# Patient Record
Sex: Male | Born: 1945 | Race: White | Hispanic: No | Smoking: Never smoker
Health system: Southern US, Community
[De-identification: ages and names within clinical notes are randomized; demographics above are authoritative.]

## PROBLEM LIST (undated history)

## (undated) DIAGNOSIS — G459 Transient cerebral ischemic attack, unspecified: Secondary | ICD-10-CM

## (undated) DIAGNOSIS — G4730 Sleep apnea, unspecified: Secondary | ICD-10-CM

## (undated) DIAGNOSIS — I10 Essential (primary) hypertension: Secondary | ICD-10-CM

## (undated) DIAGNOSIS — I255 Ischemic cardiomyopathy: Secondary | ICD-10-CM

## (undated) DIAGNOSIS — N289 Disorder of kidney and ureter, unspecified: Secondary | ICD-10-CM

## (undated) DIAGNOSIS — E785 Hyperlipidemia, unspecified: Secondary | ICD-10-CM

## (undated) DIAGNOSIS — Z87442 Personal history of urinary calculi: Secondary | ICD-10-CM

## (undated) DIAGNOSIS — E119 Type 2 diabetes mellitus without complications: Secondary | ICD-10-CM

## (undated) DIAGNOSIS — F329 Major depressive disorder, single episode, unspecified: Secondary | ICD-10-CM

## (undated) DIAGNOSIS — I214 Non-ST elevation (NSTEMI) myocardial infarction: Secondary | ICD-10-CM

## (undated) DIAGNOSIS — T603X1A Toxic effect of herbicides and fungicides, accidental (unintentional), initial encounter: Secondary | ICD-10-CM

## (undated) DIAGNOSIS — F32A Depression, unspecified: Secondary | ICD-10-CM

## (undated) DIAGNOSIS — I2510 Atherosclerotic heart disease of native coronary artery without angina pectoris: Secondary | ICD-10-CM

## (undated) DIAGNOSIS — G5621 Lesion of ulnar nerve, right upper limb: Secondary | ICD-10-CM

## (undated) HISTORY — PX: CORONARY ARTERY BYPASS GRAFT: SHX141

---
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
## 2015-02-20 ENCOUNTER — Encounter (HOSPITAL_COMMUNITY): Payer: Self-pay | Admitting: Physician Assistant

## 2015-02-20 ENCOUNTER — Encounter (HOSPITAL_BASED_OUTPATIENT_CLINIC_OR_DEPARTMENT_OTHER): Payer: Self-pay | Admitting: *Deleted

## 2015-02-20 ENCOUNTER — Encounter (HOSPITAL_BASED_OUTPATIENT_CLINIC_OR_DEPARTMENT_OTHER)
Admission: EM | Disposition: A | Discharge status: Other Acute Inpatient Hospital | Payer: Self-pay | Source: Home / Self Care | Attending: Emergency Medicine

## 2015-02-20 ENCOUNTER — Encounter (HOSPITAL_COMMUNITY)
Admission: AD | Disposition: A | Discharge status: Home / Self Care | Payer: Self-pay | Source: Ambulatory Visit | Attending: Cardiovascular Disease

## 2015-02-20 ENCOUNTER — Emergency Department (HOSPITAL_BASED_OUTPATIENT_CLINIC_OR_DEPARTMENT_OTHER)
Admission: EM | Admit: 2015-02-20 | Discharge: 2015-02-20 | Disposition: A | Discharge status: Other Acute Inpatient Hospital | Payer: Medicare Other | Source: Home / Self Care | Attending: Emergency Medicine | Admitting: Emergency Medicine

## 2015-02-20 ENCOUNTER — Inpatient Hospital Stay (HOSPITAL_COMMUNITY)
Admission: AD | Admit: 2015-02-20 | Discharge: 2015-02-22 | DRG: 247 | Disposition: A | Discharge status: Home / Self Care | Payer: Medicare Other | Source: Ambulatory Visit | Attending: Cardiovascular Disease | Admitting: Cardiovascular Disease

## 2015-02-20 DIAGNOSIS — E785 Hyperlipidemia, unspecified: Secondary | ICD-10-CM

## 2015-02-20 DIAGNOSIS — I25810 Atherosclerosis of coronary artery bypass graft(s) without angina pectoris: Secondary | ICD-10-CM | POA: Diagnosis present

## 2015-02-20 DIAGNOSIS — I25119 Atherosclerotic heart disease of native coronary artery with unspecified angina pectoris: Secondary | ICD-10-CM | POA: Insufficient documentation

## 2015-02-20 DIAGNOSIS — E119 Type 2 diabetes mellitus without complications: Secondary | ICD-10-CM

## 2015-02-20 DIAGNOSIS — Z951 Presence of aortocoronary bypass graft: Secondary | ICD-10-CM | POA: Insufficient documentation

## 2015-02-20 DIAGNOSIS — I213 ST elevation (STEMI) myocardial infarction of unspecified site: Secondary | ICD-10-CM

## 2015-02-20 DIAGNOSIS — Z7982 Long term (current) use of aspirin: Secondary | ICD-10-CM

## 2015-02-20 DIAGNOSIS — I2109 ST elevation (STEMI) myocardial infarction involving other coronary artery of anterior wall: Secondary | ICD-10-CM | POA: Diagnosis not present

## 2015-02-20 DIAGNOSIS — I10 Essential (primary) hypertension: Secondary | ICD-10-CM

## 2015-02-20 DIAGNOSIS — I2510 Atherosclerotic heart disease of native coronary artery without angina pectoris: Secondary | ICD-10-CM

## 2015-02-20 DIAGNOSIS — Z8673 Personal history of transient ischemic attack (TIA), and cerebral infarction without residual deficits: Secondary | ICD-10-CM

## 2015-02-20 DIAGNOSIS — I2121 ST elevation (STEMI) myocardial infarction involving left circumflex coronary artery: Secondary | ICD-10-CM | POA: Diagnosis not present

## 2015-02-20 DIAGNOSIS — I2102 ST elevation (STEMI) myocardial infarction involving left anterior descending coronary artery: Secondary | ICD-10-CM | POA: Diagnosis present

## 2015-02-20 DIAGNOSIS — Z9889 Other specified postprocedural states: Secondary | ICD-10-CM

## 2015-02-20 DIAGNOSIS — R55 Syncope and collapse: Secondary | ICD-10-CM | POA: Diagnosis present

## 2015-02-20 DIAGNOSIS — Z79899 Other long term (current) drug therapy: Secondary | ICD-10-CM

## 2015-02-20 DIAGNOSIS — I209 Angina pectoris, unspecified: Secondary | ICD-10-CM

## 2015-02-20 HISTORY — DX: Hyperlipidemia, unspecified: E78.5

## 2015-02-20 HISTORY — DX: Ischemic cardiomyopathy: I25.5

## 2015-02-20 HISTORY — DX: Disorder of kidney and ureter, unspecified: N28.9

## 2015-02-20 HISTORY — DX: Type 2 diabetes mellitus without complications: E11.9

## 2015-02-20 HISTORY — DX: Transient cerebral ischemic attack, unspecified: G45.9

## 2015-02-20 HISTORY — PX: CARDIAC CATHETERIZATION: SHX172

## 2015-02-20 HISTORY — DX: Essential (primary) hypertension: I10

## 2015-02-20 HISTORY — DX: Atherosclerotic heart disease of native coronary artery without angina pectoris: I25.10

## 2015-02-20 LAB — COMPREHENSIVE METABOLIC PANEL
ALT: 67 U/L — ABNORMAL HIGH (ref 17–63)
AST: 84 U/L — ABNORMAL HIGH (ref 15–41)
Albumin: 4.1 g/dL (ref 3.5–5.0)
Alkaline Phosphatase: 84 U/L (ref 38–126)
Anion gap: 9 (ref 5–15)
BUN: 16 mg/dL (ref 6–20)
CO2: 24 mmol/L (ref 22–32)
Calcium: 9.3 mg/dL (ref 8.9–10.3)
Chloride: 104 mmol/L (ref 101–111)
Creatinine, Ser: 1.29 mg/dL — ABNORMAL HIGH (ref 0.61–1.24)
GFR calc Af Amer: >60 mL/min (ref >60)
GFR calc non Af Amer: 55 mL/min — ABNORMAL LOW (ref >60)
Glucose, Bld: 352 mg/dL — ABNORMAL HIGH (ref 65–99)
POTASSIUM: 4.2 mmol/L (ref 3.5–5.1)
Sodium: 137 mmol/L (ref 135–145)
Total Bilirubin: 0.6 mg/dL (ref 0.3–1.2)
Total Protein: 7.3 g/dL (ref 6.5–8.1)

## 2015-02-20 LAB — POCT I-STAT, CHEM 8
BUN: 22 mg/dL — AB (ref 6–20)
CALCIUM ION: 1.26 mmol/L (ref 1.13–1.30)
CREATININE: 1.2 mg/dL (ref 0.61–1.24)
Chloride: 103 mmol/L (ref 101–111)
Glucose, Bld: 367 mg/dL — ABNORMAL HIGH (ref 65–99)
HEMATOCRIT: 44 % (ref 39.0–52.0)
HEMOGLOBIN: 15 g/dL (ref 13.0–17.0)
POTASSIUM: 4.3 mmol/L (ref 3.5–5.1)
SODIUM: 137 mmol/L (ref 135–145)
TCO2: 24 mmol/L (ref 0–100)

## 2015-02-20 LAB — MRSA PCR SCREENING: MRSA by PCR: NEGATIVE

## 2015-02-20 LAB — TROPONIN I
TROPONIN I: 0.11 ng/mL — AB (ref <0.031)
TROPONIN I: 22.04 ng/mL — AB (ref <0.031)

## 2015-02-20 LAB — APTT: APTT: 66 s — AB (ref 24–37)

## 2015-02-20 LAB — CBC
HEMATOCRIT: 41.5 % (ref 39.0–52.0)
HEMOGLOBIN: 14.3 g/dL (ref 13.0–17.0)
MCH: 32.1 pg (ref 26.0–34.0)
MCHC: 34.5 g/dL (ref 30.0–36.0)
MCV: 93 fL (ref 78.0–100.0)
Platelets: 244 10*3/uL (ref 150–400)
RBC: 4.46 MIL/uL (ref 4.22–5.81)
RDW: 13.7 % (ref 11.5–15.5)
WBC: 10.8 10*3/uL — AB (ref 4.0–10.5)

## 2015-02-20 LAB — GLUCOSE, CAPILLARY
GLUCOSE-CAPILLARY: 219 mg/dL — AB (ref 65–99)
Glucose-Capillary: 225 mg/dL — ABNORMAL HIGH (ref 65–99)

## 2015-02-20 LAB — POCT ACTIVATED CLOTTING TIME: ACTIVATED CLOTTING TIME: 712 s

## 2015-02-20 LAB — PROTIME-INR
INR: 1.18 (ref 0.00–1.49)
Prothrombin Time: 15.1 seconds (ref 11.6–15.2)

## 2015-02-20 LAB — CK TOTAL AND CKMB (NOT AT ARMC)
CK TOTAL: 88 U/L (ref 49–397)
CK, MB: 3.2 ng/mL (ref 0.5–5.0)
Relative Index: INVALID (ref 0.0–2.5)

## 2015-02-20 LAB — TSH: TSH: 0.881 u[IU]/mL (ref 0.350–4.500)

## 2015-02-20 SURGERY — LEFT HEART CATH AND CORONARY ANGIOGRAPHY
Anesthesia: LOCAL

## 2015-02-20 MED ORDER — MIDAZOLAM HCL 2 MG/2ML IJ SOLN
INTRAMUSCULAR | Status: DC | PRN
  Administered 2015-02-20 (×3): 1 mg via INTRAVENOUS
  Administered 2015-02-20: 2 mg via INTRAVENOUS

## 2015-02-20 MED ORDER — SODIUM CHLORIDE 0.9 % IJ SOLN
3.0000 mL | Freq: Two times a day (BID) | INTRAMUSCULAR | Status: DC
  Administered 2015-02-20 – 2015-02-21 (×3): 3 mL via INTRAVENOUS

## 2015-02-20 MED ORDER — INSULIN ASPART 100 UNIT/ML ~~LOC~~ SOLN
0.0000 [IU] | Freq: Three times a day (TID) | SUBCUTANEOUS | Status: DC
  Administered 2015-02-21: 5 [IU] via SUBCUTANEOUS
  Administered 2015-02-21 – 2015-02-22 (×4): 2 [IU] via SUBCUTANEOUS

## 2015-02-20 MED ORDER — TICAGRELOR 90 MG PO TABS
ORAL_TABLET | ORAL | Status: AC
  Filled 2015-02-20: qty 1

## 2015-02-20 MED ORDER — MORPHINE SULFATE 2 MG/ML IJ SOLN
INTRAMUSCULAR | Status: AC
  Administered 2015-02-20: 2 mg via INTRAVENOUS
  Filled 2015-02-20: qty 1

## 2015-02-20 MED ORDER — SODIUM CHLORIDE 0.9 % IV SOLN: 1.7500 mg/kg/h | INTRAVENOUS | Status: AC

## 2015-02-20 MED ORDER — SODIUM CHLORIDE 0.9 % IV SOLN
0.2500 mg/kg/h | INTRAVENOUS | Status: DC
  Filled 2015-02-20 (×2): qty 250

## 2015-02-20 MED ORDER — SODIUM CHLORIDE 0.9 % IV SOLN
1.7500 mg/kg/h | INTRAVENOUS | Status: AC
  Administered 2015-02-20: 1.75 mg/kg/h via INTRAVENOUS
  Filled 2015-02-20: qty 250

## 2015-02-20 MED ORDER — HEPARIN (PORCINE) IN NACL 100-0.45 UNIT/ML-% IJ SOLN
INTRAMUSCULAR | Status: AC
  Filled 2015-02-20: qty 250

## 2015-02-20 MED ORDER — ONDANSETRON HCL 4 MG/2ML IJ SOLN: 4.0000 mg | Freq: Four times a day (QID) | INTRAMUSCULAR | Status: DC | PRN

## 2015-02-20 MED ORDER — NITROGLYCERIN 1 MG/10 ML FOR IR/CATH LAB
INTRA_ARTERIAL | Status: AC
  Filled 2015-02-20: qty 10

## 2015-02-20 MED ORDER — SODIUM CHLORIDE 0.9 % IJ SOLN: 3.0000 mL | INTRAMUSCULAR | Status: DC | PRN

## 2015-02-20 MED ORDER — MIDAZOLAM HCL 2 MG/2ML IJ SOLN
INTRAMUSCULAR | Status: AC
  Filled 2015-02-20: qty 2

## 2015-02-20 MED ORDER — SODIUM CHLORIDE 0.9 % IV SOLN
250.0000 mg | INTRAVENOUS | Status: DC | PRN
  Administered 2015-02-20: 1.75 mg/kg/h via INTRAVENOUS

## 2015-02-20 MED ORDER — HEPARIN (PORCINE) IN NACL 100-0.45 UNIT/ML-% IJ SOLN
10.0000 [IU]/kg/h | Freq: Once | INTRAMUSCULAR | Status: DC
  Administered 2015-02-20: 10 [IU]/kg/h via INTRAVENOUS

## 2015-02-20 MED ORDER — ATORVASTATIN CALCIUM 80 MG PO TABS
80.0000 mg | ORAL_TABLET | Freq: Every day | ORAL | Status: DC
Start: 2015-02-20 — End: 2015-02-22
  Administered 2015-02-20 – 2015-02-21 (×2): 80 mg via ORAL
  Filled 2015-02-20 (×3): qty 1

## 2015-02-20 MED ORDER — METOPROLOL TARTRATE 25 MG PO TABS
25.0000 mg | ORAL_TABLET | Freq: Two times a day (BID) | ORAL | Status: DC
  Administered 2015-02-20 – 2015-02-22 (×4): 25 mg via ORAL
  Filled 2015-02-20 (×5): qty 1

## 2015-02-20 MED ORDER — SODIUM CHLORIDE 0.9 % IV SOLN
0.2500 mg/kg/h | INTRAVENOUS | Status: DC
  Filled 2015-02-20: qty 250

## 2015-02-20 MED ORDER — NITROGLYCERIN IN D5W 200-5 MCG/ML-% IV SOLN
INTRAVENOUS | Status: AC
  Filled 2015-02-20: qty 250

## 2015-02-20 MED ORDER — LIDOCAINE HCL (PF) 1 % IJ SOLN
INTRAMUSCULAR | Status: DC | PRN
  Administered 2015-02-20: 12 mL via SUBCUTANEOUS

## 2015-02-20 MED ORDER — BIVALIRUDIN 250 MG IV SOLR
INTRAVENOUS | Status: AC
  Filled 2015-02-20: qty 250

## 2015-02-20 MED ORDER — MORPHINE SULFATE 2 MG/ML IJ SOLN
2.0000 mg | INTRAMUSCULAR | Status: DC | PRN
  Administered 2015-02-20 (×2): 2 mg via INTRAVENOUS
  Filled 2015-02-20: qty 1

## 2015-02-20 MED ORDER — TICAGRELOR 90 MG PO TABS
90.0000 mg | ORAL_TABLET | Freq: Two times a day (BID) | ORAL | Status: DC
Start: 2015-02-20 — End: 2015-02-22
  Administered 2015-02-20 – 2015-02-22 (×4): 90 mg via ORAL
  Filled 2015-02-20 (×5): qty 1

## 2015-02-20 MED ORDER — ASPIRIN 81 MG PO CHEW
324.0000 mg | CHEWABLE_TABLET | Freq: Once | ORAL | Status: AC
  Administered 2015-02-20: 324 mg via ORAL

## 2015-02-20 MED ORDER — HEPARIN (PORCINE) IN NACL 100-0.45 UNIT/ML-% IJ SOLN
1100.0000 [IU]/h | Freq: Once | INTRAMUSCULAR | Status: DC
Start: 2015-02-20 — End: 2015-02-20

## 2015-02-20 MED ORDER — TICAGRELOR 90 MG PO TABS
ORAL_TABLET | ORAL | Status: DC | PRN
  Administered 2015-02-20: 180 mg via ORAL

## 2015-02-20 MED ORDER — HEPARIN SODIUM (PORCINE) 5000 UNIT/ML IJ SOLN
4000.0000 [IU] | Freq: Once | INTRAMUSCULAR | Status: AC
  Administered 2015-02-20: 4000 [IU] via INTRAVENOUS

## 2015-02-20 MED ORDER — FENTANYL CITRATE (PF) 100 MCG/2ML IJ SOLN
INTRAMUSCULAR | Status: AC
  Filled 2015-02-20: qty 2

## 2015-02-20 MED ORDER — ATROPINE SULFATE 0.1 MG/ML IJ SOLN
INTRAMUSCULAR | Status: AC
  Filled 2015-02-20: qty 10

## 2015-02-20 MED ORDER — ASPIRIN 81 MG PO CHEW
CHEWABLE_TABLET | ORAL | Status: AC
  Filled 2015-02-20: qty 4

## 2015-02-20 MED ORDER — ACETAMINOPHEN 325 MG PO TABS
650.0000 mg | ORAL_TABLET | ORAL | Status: DC | PRN
  Administered 2015-02-20: 650 mg via ORAL
  Filled 2015-02-20: qty 2

## 2015-02-20 MED ORDER — PNEUMOCOCCAL VAC POLYVALENT 25 MCG/0.5ML IJ INJ
0.5000 mL | INJECTION | INTRAMUSCULAR | Status: AC
  Administered 2015-02-21: 0.5 mL via INTRAMUSCULAR
  Filled 2015-02-20: qty 0.5

## 2015-02-20 MED ORDER — SODIUM CHLORIDE 0.9 % IV SOLN
INTRAVENOUS | Status: DC | PRN
  Administered 2015-02-20: 150 mL/h via INTRAVENOUS

## 2015-02-20 MED ORDER — SODIUM CHLORIDE 0.9 % IV SOLN: 250.0000 mL | INTRAVENOUS | Status: DC | PRN

## 2015-02-20 MED ORDER — ASPIRIN EC 81 MG PO TBEC
81.0000 mg | DELAYED_RELEASE_TABLET | Freq: Every day | ORAL | Status: DC
  Administered 2015-02-20 – 2015-02-22 (×3): 81 mg via ORAL
  Filled 2015-02-20 (×3): qty 1

## 2015-02-20 MED ORDER — NITROGLYCERIN 1 MG/10 ML FOR IR/CATH LAB
INTRA_ARTERIAL | Status: DC | PRN
  Administered 2015-02-20 (×2): 200 ug via INTRACORONARY

## 2015-02-20 MED ORDER — FENTANYL CITRATE (PF) 100 MCG/2ML IJ SOLN
INTRAMUSCULAR | Status: DC | PRN
  Administered 2015-02-20 (×2): 25 ug via INTRAVENOUS
  Administered 2015-02-20: 50 ug via INTRAVENOUS
  Administered 2015-02-20: 25 ug via INTRAVENOUS

## 2015-02-20 MED ORDER — IOHEXOL 350 MG/ML SOLN
INTRAVENOUS | Status: DC | PRN
  Administered 2015-02-20: 535 mL via INTRAVENOUS

## 2015-02-20 MED ORDER — BIVALIRUDIN BOLUS VIA INFUSION - CUPID
INTRAVENOUS | Status: DC | PRN
  Administered 2015-02-20: 61.95 mg via INTRAVENOUS

## 2015-02-20 MED ORDER — DIAZEPAM 5 MG PO TABS
5.0000 mg | ORAL_TABLET | Freq: Four times a day (QID) | ORAL | Status: DC | PRN
  Administered 2015-02-20: 5 mg via ORAL
  Filled 2015-02-20: qty 1

## 2015-02-20 MED ORDER — HYDRALAZINE HCL 20 MG/ML IJ SOLN
10.0000 mg | INTRAMUSCULAR | Status: DC | PRN
  Administered 2015-02-20: 10 mg via INTRAVENOUS
  Filled 2015-02-20: qty 1

## 2015-02-20 MED ORDER — NITROGLYCERIN IN D5W 200-5 MCG/ML-% IV SOLN: 0.0000 ug/min | INTRAVENOUS | Status: DC

## 2015-02-20 MED ORDER — LABETALOL HCL 5 MG/ML IV SOLN
20.0000 mg | Freq: Once | INTRAVENOUS | Status: AC
  Administered 2015-02-20: 20 mg via INTRAVENOUS
  Filled 2015-02-20: qty 4

## 2015-02-20 MED ORDER — SODIUM CHLORIDE 0.9 % IV SOLN
INTRAVENOUS | Status: AC
Start: 2015-02-20 — End: 2015-02-20
  Administered 2015-02-20: 18:00:00 via INTRAVENOUS

## 2015-02-20 MED ORDER — NITROGLYCERIN IN D5W 200-5 MCG/ML-% IV SOLN
INTRAVENOUS | Status: DC | PRN
  Administered 2015-02-20: 10 ug/min via INTRAVENOUS

## 2015-02-20 SURGICAL SUPPLY — 36 items
BALLN EMERGE MR 2.0X12 (BALLOONS) ×3
BALLN EMERGE MR 2.0X15 (BALLOONS) ×3
BALLN EMERGE MR 2.25X12 (BALLOONS) ×3
BALLN EMERGE MR 2.75X12 (BALLOONS) ×3
BALLN EMERGE MR 3.0X12 (BALLOONS) ×3
BALLN EMERGE MR 3.5X15 (BALLOONS) ×3
BALLN ~~LOC~~ EMERGE MR 2.5X30 (BALLOONS) ×3
BALLN ~~LOC~~ EMERGE MR 4.0X12 (BALLOONS) ×3
BALLOON EMERGE MR 2.0X12 (BALLOONS) ×1 IMPLANT
BALLOON EMERGE MR 2.0X15 (BALLOONS) ×1 IMPLANT
BALLOON EMERGE MR 2.25X12 (BALLOONS) ×1 IMPLANT
BALLOON EMERGE MR 2.75X12 (BALLOONS) ×1 IMPLANT
BALLOON EMERGE MR 3.0X12 (BALLOONS) ×1 IMPLANT
BALLOON EMERGE MR 3.5X15 (BALLOONS) ×1 IMPLANT
BALLOON ~~LOC~~ EMERGE MR 2.5X30 (BALLOONS) ×1 IMPLANT
BALLOON ~~LOC~~ EMERGE MR 4.0X12 (BALLOONS) ×1 IMPLANT
CATH INFINITI 5 FR IM (CATHETERS) ×3 IMPLANT
CATH INFINITI 5FR MULTPACK ANG (CATHETERS) ×3 IMPLANT
CATH VISTA GUIDE 6FR XBLAD3.5 (CATHETERS) ×3 IMPLANT
ELECT DEFIB PAD ADLT CADENCE (PAD) ×3 IMPLANT
GUIDE CATH VISTA IMA 6F (CATHETERS) ×3 IMPLANT
HOVERMATT SINGLE USE (MISCELLANEOUS) ×3 IMPLANT
KIT ENCORE 26 ADVANTAGE (KITS) ×3 IMPLANT
KIT HEART LEFT (KITS) ×3 IMPLANT
PACK CARDIAC CATHETERIZATION (CUSTOM PROCEDURE TRAY) ×3 IMPLANT
SHEATH PINNACLE 6F 10CM (SHEATH) ×3 IMPLANT
STENT RESOLUTE INTEG 3.5X15 (Permanent Stent) ×3 IMPLANT
STENT SYNERGY DES 2.25X32 (Permanent Stent) ×3 IMPLANT
SYR MEDRAD MARK V 150ML (SYRINGE) ×3 IMPLANT
TRANSDUCER W/STOPCOCK (MISCELLANEOUS) ×3 IMPLANT
TUBING CIL FLEX 10 FLL-RA (TUBING) ×3 IMPLANT
WIRE ASAHI PROWATER 180CM (WIRE) ×3 IMPLANT
WIRE COUGAR XT STRL 190CM (WIRE) ×3 IMPLANT
WIRE EMERALD 3MM-J .035X150CM (WIRE) ×3 IMPLANT
WIRE EMERALD 3MM-J .035X260CM (WIRE) ×3 IMPLANT
WIRE LUGE 182CM (WIRE) ×3 IMPLANT

## 2015-02-20 NOTE — Progress Notes (Signed)
CRITICAL VALUE ALERT  Critical value received:  Troponin 22.04  Date of notification:  02/20/15  Time of notification:  1945  Critical value read back:Yes.    Nurse who received alert:  Daine Floras  MD notified (1st page):  Dr. Herbie Baltimore  Time of first page:  1945  Responding MD:  Dr. Herbie Baltimore  Time MD responded:  [phone]

## 2015-02-20 NOTE — ED Notes (Signed)
Report called to [facility] at [facility] cath lab.

## 2015-02-20 NOTE — ED Notes (Signed)
Pt taken from registration to room 4, declines w/c, ekg performed while pt being triaged, shown to dr. Fayrene Fearing. Pt reports he was walking his dog this am, and "I got really sweaty, really dizzy, and had this chest feeling like indigestion..I think I may have passed out, I woke up on the ground with abrasions on my arm." Pt states his left arm is numb and tingly, he walked home from walking his dog, called his son who brought him here.

## 2015-02-20 NOTE — ED Notes (Signed)
Pt left ed in care of [place] [facility], to be transported directly to [facility] cath lab, son is following ems in his pov.

## 2015-02-20 NOTE — Progress Notes (Signed)
Right Femoral sheathe pulled at 21:48 by two RN's. Pressure held for 20 minutes with no complications. Sight is level 0 and will continue to monitor.

## 2015-02-20 NOTE — Progress Notes (Signed)
ANTICOAGULATION CONSULT NOTE  Pharmacy Consult for bivalrudin Indication: chest pain/ACS  No Known Allergies  Patient Measurements: Height:  (170.2 cm) Weight: 181 lb 14.1 oz (82.5 kg) IBW/kg (Calculated) : 66.1 Heparin Dosing Weight:   Vital Signs: Temp: 97.4 F (36.3 C) (07/22 1639) Temp Source: Oral (07/22 1639) BP: 134/91 mmHg (07/22 1555) Pulse Rate: 67 (07/22 1645)  Labs:  Recent Labs  02/20/15 1213 02/20/15 1214  HGB 14.3 15.0  HCT 41.5 44.0  PLT 244  --   APTT 66*  --   LABPROT 15.1  --   INR 1.18  --   CREATININE 1.29* 1.20  CKTOTAL 88  --   CKMB 3.2  --   TROPONINI 0.11*  --     Estimated Creatinine Clearance: 60.6 mL/min (by C-G formula based on Cr of 1.2).   Medical History: Past Medical History  Diagnosis Date  . Hypertension   . Diabetes mellitus without complication   . Renal disorder   . CAD (coronary artery disease)     a. s/p CABG ~2003.  [name] TIA (transient ischemic attack)     Medications:  Prescriptions prior to admission  Medication Sig Dispense Refill Last Dose  . aspirin 81 MG tablet Take 81 mg by mouth daily.     [name] atorvastatin (LIPITOR) 80 MG tablet Take 80 mg by mouth daily.   02/19/2015 at Unknown time  . losartan (COZAAR) 25 MG tablet Take 25 mg by mouth daily.   02/19/2015 at Unknown time  . metFORMIN (GLUCOPHAGE-XR) 500 MG 24 hr tablet Take 1,000 mg by mouth daily with breakfast.   02/20/2015 at Unknown time  . metoprolol succinate (TOPROL-XL) 50 MG 24 hr tablet Take 50 mg by mouth daily. Take with or immediately following a meal.   02/19/2015 at Unknown time    Assessment: 69 yo male admitted with STEMI, s/p cath, plan to continue bivalrudin for 2 hours. Results from cath not available yet.  Scr 1.2, eCrCl 55-60 ml/min, cbc stable.   Goal of Therapy:  aPTT 66-102 seconds Monitor platelets by anticoagulation protocol: Yes   Plan:  -Continue bivalrudin 1.75 mg/kg/hr for 2 hours post cath -Monitor s/sx bleeding, aptt as  needed   Agapito Games, PharmD, BCPS Clinical Pharmacist Pager: [phone] 02/20/2015 5:57 PM

## 2015-02-20 NOTE — Progress Notes (Signed)
ANTICOAGULATION CONSULT NOTE - Initial Consult  Pharmacy Consult for Heparin  Indication: chest pain/ACS  No Known Allergies  Patient Measurements: Height:  (170.2 cm) Weight: 182 lb (82.555 kg) IBW/kg (Calculated) : 66.1 Heparin Dosing Weight: 82 kg   Vital Signs: BP: 171/99 mmHg (07/22 1105) Pulse Rate: 69 (07/22 1105)  Labs: No results for input(s): HGB, HCT, PLT, APTT, LABPROT, INR, HEPARINUNFRC, CREATININE, CKTOTAL, CKMB, TROPONINI in the last 72 hours.  CrCl cannot be calculated (Patient has no serum creatinine result on file.).   Medical History: Past Medical History  Diagnosis Date  . Hypertension   . Diabetes mellitus without complication   . Renal disorder   . CAD (coronary artery disease)     Medications:   (Not in a hospital admission)  Assessment: 68 YOM who presented with syncope and concern for STEMI. In [facility], EKG demonstrated ST elevation. Pharmacy consulted to start heparin infusion. H/H and Plt wnl. Pt was not on any anticoagulants prior to admission   Goal of Therapy:  Heparin level 0.3-0.7 units/ml Monitor platelets by anticoagulation protocol: Yes   Plan:  -Heparin 4000 units bolus followed by infusion at 1100 units/hr  -F/u after cath  -Monitor daily CBC, HL and s/s of bleeding   Vinnie Level, PharmD., BCPS Clinical Pharmacist Pager [phone]

## 2015-02-20 NOTE — ED Notes (Signed)
[facility] main pharmacy phoned for heparin dosing. Pharmacist will dose asap and place orders in epic.

## 2015-02-20 NOTE — ED Provider Notes (Signed)
CSN: [id number]     Arrival date & time 02/20/15  1048 History   None    Chief Complaint  Patient presents with  . Chest Pain     HPI  She presents for evaluation after her chest pain and near-syncope. History of CABG over 10 years ago. Was walking his dog this morning and has been in his normal state of health. States he began to feel "weird" and developed a lightheaded feeling and it up on the ground. It is not certain if he had complete syncope. However he did have an abrasion. He got up and was able to walk his dog back to his house. He developed some discomfort in his chest and a numb left arm. This is actually resolved. However, he presents here. History of hypertension diabetes coronary disease status post coronary artery bypass grafting. He is a retired orthopedic PA.  Past Medical History  Diagnosis Date  . Essential hypertension   . Diabetes mellitus without complication   . Renal disorder   . CAD (coronary artery disease)     a. s/p CABG x3 (LIMA->LAD, Y radial graft off of LIMA to OM3 and RPL1) ~2003;  b. 01/2015 STEMI (aVR/aVL)/PCI: LM 95/LCX 100p (3.5x15 Resolute DES covering LM/LCX), LAD 95ost/p (PTCA), 16m, 90d after LIMA insertion (2.25x32 Synergy DES), RCA 95/77m, 100d, LIMA->LAD patent, free radial comes off of LIMA and plugs into OM3 (ok) and then RPL1 (ok). 50% stenosis in graft between touchdowns. Nl EF.  [name] TIA (transient ischemic attack)   . Hyperlipidemia   . Ischemic cardiomyopathy     a.  Echo 02/22/15:   EF 35-40%, mod diff HK, WMA cannot be excluded, mod LAE, mild RVE, mild to mod RAE   Past Surgical History  Procedure Laterality Date  . Coronary artery bypass graft    . Cardiac catheterization N/A 02/20/2015    Procedure: Left Heart Cath and Coronary Angiography;  Surgeon: Lennette Bihari, MD;  Location: [facility] INVASIVE CV LAB;  Service: Cardiovascular;  Laterality: N/A;  . Cardiac catheterization N/A 02/20/2015    Procedure: Coronary Stent Intervention;  Surgeon:  Lennette Bihari, MD;  Location: [facility] INVASIVE CV LAB;  Service: Cardiovascular;  Laterality: N/A;   Family History  Problem Relation Age of Onset  . Congestive Heart Failure Mother    History  Substance Use Topics  . Smoking status: Never Smoker   . Smokeless tobacco: Not on file  . Alcohol Use: 0.0 oz/week    0 Standard drinks or equivalent per week     Comment: very rare beer    Review of Systems  Constitutional: Negative for fever, chills, diaphoresis, appetite change and fatigue.  HENT: Negative for mouth sores, sore throat and trouble swallowing.   Eyes: Negative for visual disturbance.  Respiratory: Positive for shortness of breath. Negative for cough, chest tightness and wheezing.   Cardiovascular: Positive for chest pain.  Gastrointestinal: Negative for nausea, vomiting, abdominal pain, diarrhea and abdominal distention.  Endocrine: Negative for polydipsia, polyphagia and polyuria.  Genitourinary: Negative for dysuria, frequency and hematuria.  Musculoskeletal: Negative for gait problem.  Skin: Negative for color change, pallor and rash.  Neurological: Positive for syncope. Negative for dizziness, light-headedness and headaches.  Hematological: Does not bruise/bleed easily.  Psychiatric/Behavioral: Negative for behavioral problems and confusion.      Allergies  Review of patient's allergies indicates no known allergies.  Home Medications   Prior to Admission medications   Medication Sig Start Date End Date Taking? Authorizing Provider  atorvastatin (LIPITOR) 80 MG tablet Take 80 mg by mouth daily.   Yes Historical Provider, MD  losartan (COZAAR) 25 MG tablet Take 25 mg by mouth daily.   Yes Historical Provider, MD  aspirin 81 MG tablet Take 81 mg by mouth daily.    Historical Provider, MD  Coenzyme Q10 (COQ10 PO) Take 1 capsule by mouth daily.    Historical Provider, MD  Cyanocobalamin (B-12 PO) Take 1 tablet by mouth daily.    Historical Provider, MD   glucosamine-chondroitin 500-400 MG tablet Take 1 tablet by mouth daily.    Historical Provider, MD  metFORMIN (GLUCOPHAGE-XR) 500 MG 24 hr tablet Take 1 tablet (500 mg total) by mouth daily with breakfast. 02/22/15   Beatrice Lecher, PA-C  metoprolol (LOPRESSOR) 50 MG tablet Take 0.5 tablets (25 mg total) by mouth 2 (two) times daily. 02/22/15   Ok Anis, NP  nitroGLYCERIN (NITROSTAT) 0.4 MG SL tablet Place 1 tablet (0.4 mg total) under the tongue every 5 (five) minutes as needed for chest pain. 02/22/15   Ok Anis, NP  ticagrelor (BRILINTA) 90 MG TABS tablet Take 1 tablet (90 mg total) by mouth 2 (two) times daily. 02/22/15   Ok Anis, NP  vitamin C (ASCORBIC ACID) 500 MG tablet Take 500 mg by mouth daily.    Historical Provider, MD   BP 171/99 mmHg  Pulse 69  Resp 20  Ht  (1.702 m)  Wt 182 lb (82.555 kg)  BMI 28.50 kg/m2  SpO2 100% Physical Exam  Constitutional: He is oriented to person, place, and time. He appears well-developed and well-nourished. No distress.  HENT:  Head: Normocephalic.  Eyes: Conjunctivae are normal. Pupils are equal, round, and reactive to light. No scleral icterus.  Neck: Normal range of motion. Neck supple. No thyromegaly present.  Cardiovascular: Normal rate and regular rhythm.  Exam reveals no gallop and no friction rub.   No murmur heard. Pulmonary/Chest: Effort normal and breath sounds normal. No respiratory distress. He has no wheezes. He has no rales.  Abdominal: Soft. Bowel sounds are normal. He exhibits no distension. There is no tenderness. There is no rebound.  Musculoskeletal: Normal range of motion.  Neurological: He is alert and oriented to person, place, and time.  Skin: Skin is warm and dry. No rash noted.  Psychiatric: He has a normal mood and affect. His behavior is normal.    ED Course  Procedures (including critical care time) Labs Review Labs Reviewed - No data to display  Imaging Review No results  found.   EKG Interpretation None      MDM   Final diagnoses:  Ischemic chest pain  ST elevation myocardial infarction (STEMI), unspecified artery    CRITICAL CARE Performed by: Rolland Porter JOSEPH   Total critical care time: 20 Minutes. Initial evaluation, Code STEMI activation, Heparin bolus and infusion  Critical care time was exclusive of separately billable procedures and treating other patients.  Critical care was necessary to treat or prevent imminent or life-threatening deterioration.  Critical care was time spent personally by me on the following activities: development of treatment plan with patient and/or surrogate as well as nursing, discussions with consultants, evaluation of patient's response to treatment, examination of patient, obtaining history from patient or surrogate, ordering and performing treatments and interventions, ordering and review of laboratory studies, ordering and review of radiographic studies, pulse oximetry and re-evaluation of patient's condition. He   presents here pain-free. EKG concerning. Elevation less than 1  mm in aVL. Deep T-wave inversions and ST depressions throughout his precordial lateral leads. I placed a call immediately to cardiology. This was returned to me immediately by Dr. Tresa Endo. Code STEMI initiated upon my initial evaluation of EKG. Dr. Tresa Endo has reviewed the EKG and agrees, that although the EKG has subtle elevations clinical picture and grossly abnormal EKG warrants emergent transfer for cath. Patient has no contraindications and was given IV heparin bolus. Remained symptom-free and hemodynamically stable. Kerley care quickly and patient to be transferred directly to [facility] for emergent PTCI.    Rolland Porter, MD 02/26/15 [phone]

## 2015-02-20 NOTE — H&P (Signed)
History and Physical  Patient ID: Fateh Kindle MRN: [id number], DOB: [birth year] Date of Encounter: 02/20/2015, 12:15 PM Primary Physician: No primary care provider on file. 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 He denies any additional history but med list reports losartan and atorvastatin so may have HTN and HLD as well. He moved to [place] from [place]. [facility], [place] about 2 years ago. He works part time as an Chief of Staff. He said he only recently was able to get meds filled because he had a hard time finding an internist down here that took Medicare. Of home meds, he only took Metformin this AM. He was in his usual state of health today while walking his dog this morning. He suddenly felt "weird" and lightheaded and the next thing he knew he was on the ground with some abrasions on his hands and knees. He does not think he hit his head. A passerby asked if they should call 911 but at that point he was already feeling better so he declined. He went home and began to notice left arm numbness and chest burning that felt like indigestion. At that point he knew he should seek care and went to [facility] where EKG demonstrated ST elevation in avR and aVL with significant deep reciprocal ST depressions inferiorly as well as V4-V6. Code STEMI was called. He was given  ASA and heparin bolus and transferred emergently to [facility] cath lab. Upon arrival he denies any chest pain. Telemetry shows NSR with occasional PVCs. No hx of bleeding issues or syncope before today.   Past Medical History  Diagnosis Date  . Hypertension   . Diabetes mellitus without complication   . Renal disorder   . CAD (coronary  artery disease)     a. s/p CABG ~2003.  [name] TIA (transient ischemic attack)      Most Recent Cardiac Studies: None available   Surgical History:  Past Surgical History  Procedure Laterality Date  . Coronary artery bypass graft       Home Meds: Prior to Admission medications   Medication Sig Start Date End Date Taking? Authorizing Provider  atorvastatin (LIPITOR) 80 MG tablet Take 80 mg by mouth daily.    Historical Provider, MD  losartan (COZAAR) 25 MG tablet Take 25 mg by mouth daily.    Historical Provider, MD  metFORMIN (GLUCOPHAGE-XR) 500 MG 24 hr tablet Take 1,000 mg by mouth daily with breakfast.    Historical Provider, MD  metoprolol succinate (TOPROL-XL) 50 MG 24 hr tablet Take 50 mg by mouth daily. Take with or immediately following a meal.    Historical Provider, MD    Allergies: No Known Allergies  History   Social History  . Marital Status: Married    Spouse Name: N/A  . Number of Children: N/A  . Years of Education: N/A   Occupational History  . Orthopedic Analyst    Social History Main Topics  . Smoking status: Never Smoker   . Smokeless tobacco: Not on file  . Alcohol Use: 0.0 oz/week    0 Standard drinks or equivalent per week     Comment: very rare beer  . Drug Use: No  . Sexual Activity: Not  on file   Other Topics Concern  . Not on file   Social History Narrative     Family History  Problem Relation Age of Onset  . Congestive Heart Failure Mother     Review of Systems: No hx of bleeding issues. All other systems reviewed and are otherwise negative except as noted above.  Labs: None  Radiology/Studies:  No results found. Wt Readings from Last 3 Encounters:  02/20/15 182 lb (82.555 kg)   EKG: NSR with pronounced ST elevation avR 2mm and 0.40mm in avL, significant ST depression in II, III, avF, V3-V6  Physical Exam: P 69, RR 20, BNP 171/99, Pulse Ox 100% [place] [facility]: Well developed, well nourished WM in no acute distress. Head:  Normocephalic, atraumatic, sclera non-icteric, no xanthomas, nares are without discharge.  Neck: JVD not elevated. Lungs: Clear bilaterally to auscultation without wheezes, rales, or rhonchi. Breathing is unlabored. Heart: RRR with S1 S2. No murmurs, rubs, or gallops appreciated. Abdomen: Soft, non-tender, non-distended with normoactive bowel sounds. No hepatomegaly. No rebound/guarding. No obvious abdominal masses. Msk:  Strength and tone appear normal for age. Extremities: No clubbing or cyanosis. No edema.  Distal pedal pulses slightly diminshed, 1+ but equal bilaterally. Neuro: Alert and oriented X 3. No focal deficit. No facial asymmetry. Moves all extremities spontaneously. Psych:  Responds to questions appropriately with a normal affect.    ASSESSMENT AND PLAN:   1. Chest pain and EKG concerning for acute STEMI, with history of CAD s/p CABG 13 years ago - the patient was transported directly to the [facility] Cath lab to undergo emergent cath by Dr. Tresa Endo. Further plans will depend on findings of cath.  2. Syncope - etiology unclear. Could be related to decreased cerebral perfusion in the setting of infarct but cannot exclude arrhythmia. Will need monitoring with telemetry and syncope workup once stabilized to include 2D echo and consideration of carotid duplex.  3. Probable HTN - rx of hypertension will depend on I-stat lab results and outcome of catheterization. At home he is on losartan and metoprolol.  4. Diabetes mellitus - hold Metformin. Plan SSI.    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  He believes he had 3 grafts placed.  He has a history of hypertension and hyperlipidemia.  He recently moved to [place] from [place] a approximatelyy 2 years ago to be near his daughter and grandchildren and has  not yet established Cardiologic care.  Earlier today he developed a recent syncopal spell.  He [facility] and his ECG revealed ST elevation in aVR and aVL with  ST depression inferiorly.  A code STEMI was activated.  He was given aspirin and heparin and was transported to [facility] catheterization laboratory.  Upon arrival, his chest pain has improved.  Plan emergent cardiac catheterization and probable percutaneous coronary intervention if necessary.  We will try to obtain graft data from [facility], if possible.   Lennette Bihari, MD  02/20/2015

## 2015-02-21 ENCOUNTER — Encounter (HOSPITAL_COMMUNITY): Payer: Self-pay | Admitting: Anesthesiology

## 2015-02-21 DIAGNOSIS — I2109 ST elevation (STEMI) myocardial infarction involving other coronary artery of anterior wall: Secondary | ICD-10-CM

## 2015-02-21 DIAGNOSIS — I2102 ST elevation (STEMI) myocardial infarction involving left anterior descending coronary artery: Secondary | ICD-10-CM | POA: Diagnosis not present

## 2015-02-21 DIAGNOSIS — I2510 Atherosclerotic heart disease of native coronary artery without angina pectoris: Secondary | ICD-10-CM

## 2015-02-21 LAB — GLUCOSE, CAPILLARY
GLUCOSE-CAPILLARY: 129 mg/dL — AB (ref 65–99)
Glucose-Capillary: 175 mg/dL — ABNORMAL HIGH (ref 65–99)
Glucose-Capillary: 199 mg/dL — ABNORMAL HIGH (ref 65–99)
Glucose-Capillary: 252 mg/dL — ABNORMAL HIGH (ref 65–99)

## 2015-02-21 LAB — BASIC METABOLIC PANEL
Anion gap: 12 (ref 5–15)
BUN: 13 mg/dL (ref 6–20)
CO2: 23 mmol/L (ref 22–32)
CREATININE: 0.99 mg/dL (ref 0.61–1.24)
Calcium: 8.6 mg/dL — ABNORMAL LOW (ref 8.9–10.3)
Chloride: 101 mmol/L (ref 101–111)
GFR calc Af Amer: >60 mL/min (ref >60)
GFR calc non Af Amer: >60 mL/min (ref >60)
Glucose, Bld: 218 mg/dL — ABNORMAL HIGH (ref 65–99)
Potassium: 3.9 mmol/L (ref 3.5–5.1)
Sodium: 136 mmol/L (ref 135–145)

## 2015-02-21 LAB — HEMOGLOBIN A1C
HEMOGLOBIN A1C: 9 % — AB (ref 4.8–5.6)
MEAN PLASMA GLUCOSE: 212 mg/dL

## 2015-02-21 LAB — CBC
HCT: 38.8 % — ABNORMAL LOW (ref 39.0–52.0)
Hemoglobin: 13.3 g/dL (ref 13.0–17.0)
MCH: 31.8 pg (ref 26.0–34.0)
MCHC: 34.3 g/dL (ref 30.0–36.0)
MCV: 92.8 fL (ref 78.0–100.0)
PLATELETS: 243 10*3/uL (ref 150–400)
RBC: 4.18 MIL/uL — ABNORMAL LOW (ref 4.22–5.81)
RDW: 14 % (ref 11.5–15.5)
WBC: 9.7 10*3/uL (ref 4.0–10.5)

## 2015-02-21 LAB — LIPID PANEL
Cholesterol: 263 mg/dL — ABNORMAL HIGH (ref 0–200)
HDL: 46 mg/dL (ref >40)
LDL Cholesterol: 179 mg/dL — ABNORMAL HIGH (ref 0–99)
Total CHOL/HDL Ratio: 5.7 RATIO
Triglycerides: 188 mg/dL — ABNORMAL HIGH (ref <150)
VLDL: 38 mg/dL (ref 0–40)

## 2015-02-21 LAB — HEPATIC FUNCTION PANEL
ALK PHOS: 70 U/L (ref 38–126)
ALT: 69 U/L — AB (ref 17–63)
AST: 202 U/L — ABNORMAL HIGH (ref 15–41)
Albumin: 3.7 g/dL (ref 3.5–5.0)
Bilirubin, Direct: 0.1 mg/dL (ref 0.1–0.5)
Indirect Bilirubin: 0.6 mg/dL (ref 0.3–0.9)
Total Bilirubin: 0.7 mg/dL (ref 0.3–1.2)
Total Protein: 6.2 g/dL — ABNORMAL LOW (ref 6.5–8.1)

## 2015-02-21 LAB — TROPONIN I: Troponin I: 35.08 ng/mL (ref <0.031)

## 2015-02-21 LAB — MAGNESIUM: MAGNESIUM: 1.6 mg/dL — AB (ref 1.7–2.4)

## 2015-02-21 MED ORDER — POTASSIUM CHLORIDE ER 10 MEQ PO TBCR
40.0000 meq | EXTENDED_RELEASE_TABLET | Freq: Once | ORAL | Status: AC
  Administered 2015-02-21: 40 meq via ORAL
  Filled 2015-02-21 (×2): qty 4

## 2015-02-21 MED ORDER — LOSARTAN POTASSIUM 25 MG PO TABS
25.0000 mg | ORAL_TABLET | Freq: Every day | ORAL | Status: DC
  Administered 2015-02-22: 25 mg via ORAL
  Filled 2015-02-21: qty 1

## 2015-02-21 NOTE — Progress Notes (Signed)
Patient has his home CPAP and nasal prongs. CPAP has no frayed wires.  RT will continue to monitor.

## 2015-02-21 NOTE — Progress Notes (Addendum)
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 Admitted 7/22 with ACS.   Underwent stenting of LM, native LCX and distal LAD (after LIMA). Peak troponin 35. Feels well. No CP. Walking floor.    Intake/Output Summary (Last 24 hours) at 02/21/15 1145 Last data filed at 02/21/15 1000  Gross per 24 hour  Intake 1182.38 ml  Output   1050 ml  Net 132.38 ml    Current meds: . aspirin EC  81 mg Oral Daily  . atorvastatin  80 mg Oral q1800  . insulin aspart  0-9 Units Subcutaneous TID WC  . metoprolol tartrate  25 mg Oral BID  . sodium chloride  3 mL Intravenous Q12H  . ticagrelor  90 mg Oral BID   Infusions: . nitroGLYCERIN Stopped (02/21/15 0323)     Objective:  Blood pressure 110/61, pulse 148, temperature 98.1 F (36.7 C), temperature source Oral, resp. rate 21, height 5\' 7"  (1.702 m), weight 81.92 kg (180 lb 9.6 oz), SpO2 95 %. Weight change:   Physical Exam: General:  Well appearing. No resp difficulty HEENT: normal Neck: supple. JVP . Carotids 2+ bilat; no bruits. No lymphadenopathy or thryomegaly appreciated. Cor: PMI nondisplaced. Regular rate & rhythm. No rubs, gallops or murmurs. Lungs: clear Abdomen: soft, nontender, nondistended. No hepatosplenomegaly. No bruits or masses. Good bowel sounds. Extremities: no cyanosis, clubbing, rash, edema Neuro: alert & orientedx3, cranial nerves grossly intact. moves all 4 extremities w/o difficulty. Affect pleasant  Telemetry: NSR   Lab Results: Basic Metabolic Panel:  Recent Labs Lab 02/20/15 1213 02/20/15 1214 02/21/15 0221 02/21/15 0700  NA 137 137 136  --   K 4.2 4.3 3.9  --   CL 104 103 101  --   CO2 24  --  23  --   GLUCOSE 352* 367* 218*  --   BUN 16 22* 13  --   CREATININE 1.29* 1.20 0.99  --   CALCIUM 9.3  --  8.6*  --   MG  --   --   --  1.6*   Liver Function Tests:  Recent Labs Lab 02/20/15 1213 02/21/15 0221  AST 84* 202*  ALT 67* 69*  ALKPHOS 84 70  BILITOT 0.6 0.7  PROT 7.3  6.2*  ALBUMIN 4.1 3.7   No results for input(s): LIPASE, AMYLASE in the last 168 hours. No results for input(s): AMMONIA in the last 168 hours. CBC:  Recent Labs Lab 02/20/15 1213 02/20/15 1214 02/21/15 0221  WBC 10.8*  --  9.7  HGB 14.3 15.0 13.3  HCT 41.5 44.0 38.8*  MCV 93.0  --  92.8  PLT 244  --  243   Cardiac Enzymes:  Recent Labs Lab 02/20/15 1213 02/20/15 1800 02/21/15 0221  CKTOTAL 88  --   --   CKMB 3.2  --   --   TROPONINI 0.11* 22.04* 35.08*   BNP: Invalid input(s): POCBNP CBG:  Recent Labs Lab 02/20/15 1637 02/20/15 2129 02/21/15 0808  GLUCAP 219* 225* 175*   Microbiology: No results found for: CULT No results for input(s): CULT, SDES in the last 168 hours.  Imaging: No results found.   ASSESSMENT:  1. Acute coronary syndrome (initial ECG with LM pattern - isolated ST elevation AVR. Global ST depression)   -s/p stenting of LM, native LCX and distal LAD (after LIMA) 2. CAD s/ CABG 3. Syncope due to #1 4. DM2 5. OSA on CPAP  PLAN/DISCUSSION:  Doing well post-PCI. Continue post-MI  care. Can go to floor. Check echo. Possible d/c tomorrow. Consult cardiac rehab. Continue ASA, statin, Brilinta and b-blocker. Can resume losartan as BP tolerates.    LOS: 1 day    Paul Meres, MD 02/21/2015, 11:45 AM

## 2015-02-21 NOTE — Progress Notes (Signed)
Utilization Review completed. Kailyn Dubie RN BSN CM 

## 2015-02-21 NOTE — Progress Notes (Signed)
CARDIAC REHAB PHASE I   PRE:  Rate/Rhythm: 64  BP:  Sitting: 130/54     SaO2: 97% RA  MODE:  Ambulation: 600 ft   POST:  Rate/Rhythm: 87  BP:  Sitting: 123/66     SaO2: 98% RA 1:05PM-1:57PM Patient ambulated at a steady pace independently with no assistive devices. Patient had no complaints and stated that he felt good. Education completed. Patient interested in Cardiac Rehab at [facility].   Keishawn Darsey, [place], [place] 02/21/2015 1:56 PM

## 2015-02-22 ENCOUNTER — Telehealth: Payer: Self-pay | Admitting: Physician Assistant

## 2015-02-22 ENCOUNTER — Inpatient Hospital Stay (HOSPITAL_COMMUNITY): Payer: Medicare Other

## 2015-02-22 ENCOUNTER — Encounter (HOSPITAL_COMMUNITY): Payer: Self-pay | Admitting: Nurse Practitioner

## 2015-02-22 DIAGNOSIS — E118 Type 2 diabetes mellitus with unspecified complications: Secondary | ICD-10-CM

## 2015-02-22 DIAGNOSIS — I10 Essential (primary) hypertension: Secondary | ICD-10-CM | POA: Diagnosis present

## 2015-02-22 LAB — BASIC METABOLIC PANEL
Anion gap: 10 (ref 5–15)
BUN: 15 mg/dL (ref 6–20)
CALCIUM: 8.6 mg/dL — AB (ref 8.9–10.3)
CHLORIDE: 101 mmol/L (ref 101–111)
CO2: 24 mmol/L (ref 22–32)
CREATININE: 1.07 mg/dL (ref 0.61–1.24)
GFR calc Af Amer: >60 mL/min (ref >60)
GFR calc non Af Amer: >60 mL/min (ref >60)
Glucose, Bld: 186 mg/dL — ABNORMAL HIGH (ref 65–99)
Potassium: 3.9 mmol/L (ref 3.5–5.1)
SODIUM: 135 mmol/L (ref 135–145)

## 2015-02-22 LAB — GLUCOSE, CAPILLARY
Glucose-Capillary: 175 mg/dL — ABNORMAL HIGH (ref 65–99)
Glucose-Capillary: 168 mg/dL — ABNORMAL HIGH (ref 65–99)

## 2015-02-22 MED ORDER — METFORMIN HCL ER 500 MG PO TB24: 500.0000 mg | ORAL_TABLET | Freq: Every day | ORAL | Status: DC

## 2015-02-22 MED ORDER — TICAGRELOR 90 MG PO TABS: 90.0000 mg | ORAL_TABLET | Freq: Two times a day (BID) | ORAL | Status: DC

## 2015-02-22 MED ORDER — METOPROLOL TARTRATE 50 MG PO TABS: 25.0000 mg | ORAL_TABLET | Freq: Two times a day (BID) | ORAL | Status: DC

## 2015-02-22 MED ORDER — NITROGLYCERIN 0.4 MG SL SUBL
0.4000 mg | SUBLINGUAL_TABLET | SUBLINGUAL | Status: DC | PRN
Start: 2015-02-22 — End: 2020-12-24

## 2015-02-22 NOTE — Discharge Instructions (Signed)
**  PLEASE REMEMBER TO BRING ALL OF YOUR MEDICATIONS TO EACH OF YOUR FOLLOW-UP OFFICE VISITS. ° °NO HEAVY LIFTING X 4 WEEKS. °NO SEXUAL ACTIVITY X 4 WEEKS. °NO DRIVING X 2 WEEKS. °NO SOAKING BATHS, HOT TUBS, POOLS, ETC., X 7 DAYS. ° °Groin Site Care °Refer to this sheet in the next few weeks. These instructions provide you with information on caring for yourself after your procedure. Your caregiver may also give you more specific instructions. Your treatment has been planned according to current medical practices, but problems sometimes occur. Call your caregiver if you have any problems or questions after your procedure. °HOME CARE INSTRUCTIONS °· You may shower 24 hours after the procedure. Remove the bandage (dressing) and gently wash the site with plain soap and water. Gently pat the site dry.  °· Do not apply powder or lotion to the site.  °· Do not sit in a bathtub, swimming pool, or whirlpool for 5 to 7 days.  °· No bending, squatting, or lifting anything over 10 pounds (4.5 kg) as directed by your caregiver.  °· Inspect the site at least twice daily.  °· Do not drive home if you are discharged the same day of the procedure. Have someone else drive you.  ° °What to expect: °· Any bruising will usually fade within 1 to 2 weeks.  °· Blood that collects in the tissue (hematoma) may be painful to the touch. It should usually decrease in size and tenderness within 1 to 2 weeks.  °SEEK IMMEDIATE MEDICAL CARE IF: °· You have unusual pain at the groin site or down the affected leg.  °· You have redness, warmth, swelling, or pain at the groin site.  °· You have drainage (other than a small amount of blood on the dressing).  °· You have chills.  °· You have a fever or persistent symptoms for more than 72 hours.  °· You have a fever and your symptoms suddenly get worse.  °· Your leg becomes pale, cool, tingly, or numb.  °You have heavy bleeding from the site. Hold pressure on the site. . ° °

## 2015-02-22 NOTE — Progress Notes (Signed)
CM provided pt with Brilinta booklet with 30-day free card enclosed. CM explained usage of card, and pt verbally stated understanding of card usage. Pt stated has no medication plan-coverage, [place]ME patient assistant form given to pt to help assist with Brilinta (refills) cost. Pt stated he could afford  the cost of all other medications. [facility] to confirm medication (Brilinta) in stock, and was told medication is in stock. CM made pt aware. No other needs identified per CM @ present time. Gae Gallop RN,BSN,CM [phone]

## 2015-02-22 NOTE — Progress Notes (Deleted)
Echocardiogram 2D Echocardiogram has been performed.  Otilio, Groleau 02/22/2015, 12:39 PM

## 2015-02-22 NOTE — Discharge Summary (Signed)
Discharge Summary   Patient ID: Paul Levine,  MRN: [id number], DOB/AGE: August 28, 1945 69 y.o.  Admit date: 02/20/2015 Discharge date: 02/22/2015  Primary Care Provider: No primary care provider on file. Primary Cardiologist: Bishop Limbo, MD   Discharge Diagnoses Principal Problem:   Acute ST elevation myocardial infarction (STEMI)  **s/p PCI/DES to the LM and ostial LCX, PTCA of the ostial/proximal LAD, and DES to the distal LAD via LIMA graft.  Active Problems:   CAD (coronary artery disease)   Diabetes mellitus   Essential hypertension   Hyperlipidemia  Allergies No Known Allergies  Procedures  Cardiac Catheterization and Percutaneous Coronary Intervention 7.22.2016    Coronary Findings     Dominance: Co-dominant    Left Main   . LM lesion, 95% stenosed.   [name] PCI: The LM and ostial LCX were successfully stented using a 3.5 x 15 mm Resolute DES.  [name] There is no residual stenosis post intervention.        Left Anterior Descending   . Ost LAD to Prox LAD lesion, 95% stenosed.   [name] PCI: Successful kissing balloon angioplasty followed by PTCA through the LM/LCX stent struts..  . There is a 5% residual stenosis post intervention.     . Mid LAD lesion, 100% stenosed.   Royden Purl LAD lesion, 90% stenosed.   [name] PCI: A 2.25 x 32 Synergy DES was successfully placed via the LIMA graft.  . There is no residual stenosis post intervention.        Left Circumflex   . Ost Cx lesion, 100% stenosed.   [name] PCI: The ostial LCX was stented as outlined above (LM/ostLCX).  . There is no residual stenosis post intervention.        Right Coronary Artery   . Mid RCA-1 lesion, 95% stenosed.   . Mid RCA-2 lesion, 80% stenosed.   . Dist RCA lesion, 100% stenosed.      Graft Angiography     Free LIMA Graft to Dist LAD  LIMA was injected is normal in caliber, and is anatomically normal.      The graft conduit is a radial artery. LIMA Graft to 3rd Mrg, 1st RPLB  Radial artery   . Prox Graft  lesion between 3rd Mrg and 1st RPLB, 50% stenosed.       Diagnostic Diagram          Left Ventricle The left ventricular size is normal. The left ventricular systolic function is normal. There are wall motion abnormalities in the left ventricle.  _____________   2D Echocardiogram 7.24.2016  - Left ventricle: The cavity size was normal. Systolic function was   moderately reduced. The estimated ejection fraction was in the   range of 35% to 40%. Moderate diffuse hypokinesis. Regional wall   motion abnormalities cannot be excluded. - Left atrium: The atrium was moderately dilated. - Right ventricle: The cavity size was mildly dilated. - Right atrium: The atrium was mildly to moderately dilated.  _____________   History of Present Illness  69 y/o male with a h/o CAD s/p CABG x 3 in 2003 in [place].  He has not been followed by cardiology locally.  He also has a h/o HTN and HL.  He moved to  2 yrs ago and had been off of all prior meds until about 2 wks ago.  He was in his usual state until the morning of 7/22, when he had sudden onset of lightheadedness followed by fall and possible syncope.  He subsequently  got up and noted abrasions on his hands and knees but went home where he began to experience left arm numbness and chest burning.  He then presented to the Med Center @ [facility], where he was found to ha veST elevation in leads aVR and aVL with significant deep reciprocal ST depression in inferior and lateral leads.  A Code STEMI was activated and he was transferred to the [facility] Cath Lab for further evaluation and management.  Hospital Course  Following arrival to the cath lab, patient underwent emergent diagnostic catheterization revealing severe LM, ostial LCX, distal LAD (after LIMA insertion), and RCA dzs (CTO).  He has a LIMA graft that inserts into the distal LAD.  He also has a free radial graft that anastomoses to the proximal LIMA and then inserts into the OM3 and RPL1  distally.  His graft anatomy was relatively patent with only a 50% stenosis between the OM3 and RPL1 insertions of the free radial.  Initially, it was felt that the distal LAD was the culprit lesion and this are was successfully treated with a 2.25 x 32 mm Synergy DES via the LIMA graft.  Further evaluation of anatomy showed retrograde flow up the LCX and attention was then turned to the LM and LCX.  Initially, kissing balloon angioplasty was performed (LM LCX & ostial LAD) and then the LM/ost LCX was successfully treated using a 3.5 x 15 mm Resolute DES.  The ostial LAD was then treated with PTCA through the stent struts.  EF was normal.    Patient tolerated the procedure well and post-procedure, was monitored in the coronary intensive care unit, where he eventually peaked his troponin at 35.08.  He was maintained on ASA, brilinta, bb, arb, and high potency statin therapy.  He had no further chest pain and was transferred out to the floor on 7/23.  He has been seen by cardiac rehabilitation and has been ambulating without recurrent chest pain or dyspnea.  An echocardiogram has been performed this AM and showed moderately reduced LVF with EF 35-40%.  He will be discharged home today in good condition and we will plan to see him back in clinic later this week.  Discharge Vitals Blood pressure 112/72, pulse 73, temperature 99 F (37.2 C), temperature source Oral, resp. rate 16, height  (1.702 m), weight 177 lb 14.4 oz (80.695 kg), SpO2 97 %.  Filed Weights   02/20/15 1620 02/21/15 0500 02/22/15 0503  Weight: 181 lb 14.1 oz (82.5 kg) 180 lb 9.6 oz (81.92 kg) 177 lb 14.4 oz (80.695 kg)   Labs  CBC  Recent Labs  02/20/15 1213 02/20/15 1214 02/21/15 0221  WBC 10.8*  --  9.7  HGB 14.3 15.0 13.3  HCT 41.5 44.0 38.8*  MCV 93.0  --  92.8  PLT 244  --  243   Basic Metabolic Panel  Recent Labs  02/21/15 0221 02/21/15 0700 02/22/15 0410  NA 136  --  135  K 3.9  --  3.9  CL 101  --  101    CO2 23  --  24  GLUCOSE 218*  --  186*  BUN 13  --  15  CREATININE 0.99  --  1.07  CALCIUM 8.6*  --  8.6*  MG  --  1.6*  --    Liver Function Tests  Recent Labs  02/20/15 1213 02/21/15 0221  AST 84* 202*  ALT 67* 69*  ALKPHOS 84 70  BILITOT 0.6 0.7  PROT  7.3 6.2*  ALBUMIN 4.1 3.7   Cardiac Enzymes  Recent Labs  02/20/15 1213 02/20/15 1800 02/21/15 0221  CKTOTAL 88  --   --   CKMB 3.2  --   --   TROPONINI 0.11* 22.04* 35.08*   Hemoglobin A1C  Recent Labs  02/20/15 1213  HGBA1C 9.0*   Fasting Lipid Panel  Recent Labs  02/21/15 0230  CHOL 263*  HDL 46  LDLCALC 179*  TRIG 188*  CHOLHDL 5.7   Thyroid Function Tests  Recent Labs  02/20/15 1800  TSH 0.881    Disposition  Pt is being discharged home today in good condition.  Follow-up Plans & Appointments      Follow-up Information    Follow up with [facility].   Why:  we will arrange for f/u later this week and contact you.   Contact information:   [address] [place] [address] [phone]     Discharge Medications    Medication List    STOP taking these medications        metoprolol succinate 50 MG 24 hr tablet  Commonly known as:  TOPROL-XL      TAKE these medications        aspirin 81 MG tablet  Take 81 mg by mouth daily.     atorvastatin 80 MG tablet  Commonly known as:  LIPITOR  Take 80 mg by mouth daily.     B-12 PO  Take 1 tablet by mouth daily.     COQ10 PO  Take 1 capsule by mouth daily.     glucosamine-chondroitin 500-400 MG tablet  Take 1 tablet by mouth daily.     losartan 25 MG tablet  Commonly known as:  COZAAR  Take 25 mg by mouth daily.     metFORMIN 500 MG 24 hr tablet  Commonly known as:  GLUCOPHAGE-XR  Take 1 tablet (500 mg total) by mouth daily with breakfast.     metoprolol 50 MG tablet  Commonly known as:  LOPRESSOR  Take 0.5 tablets (25 mg total) by mouth 2  (two) times daily.     nitroGLYCERIN 0.4 MG SL tablet  Commonly known as:  NITROSTAT  Place 1 tablet (0.4 mg total) under the tongue every 5 (five) minutes as needed for chest pain.     ticagrelor 90 MG Tabs tablet  Commonly known as:  BRILINTA  Take 1 tablet (90 mg total) by mouth 2 (two) times daily.     vitamin C 500 MG tablet  Commonly known as:  ASCORBIC ACID  Take 500 mg by mouth daily.       Outstanding Labs/Studies  Follow-up lipids/lft's in 8 wks.  Duration of Discharge Encounter   Greater than 30 minutes including physician time.  Signed, Tereso Newcomer, PA-C 02/22/2015, 2:13 PM

## 2015-02-22 NOTE — Progress Notes (Signed)
Subjective:  Feels well today without shortness of breath.  Currently on the floor and walking.  Had stenting of the left main into the native circumflex and distal LAD through the LIMA and had peak troponin of 35.  Wants to know what he can travel out of town in one week.  Objective:  Vital Signs in the last 24 hours: BP 112/72 mmHg  Pulse 73  Temp(Src) 99 F (37.2 C) (Oral)  Resp 16  Ht  (1.702 m)  Wt 80.695 kg (177 lb 14.4 oz)  BMI 27.86 kg/m2  SpO2 97%  Physical Exam: Pleasant male in no acute distress Lungs:  Clear Cardiac:  Regular rhythm, normal S1 and S2, no S3 Extremities:  No edema present  Intake/Output from previous day: 07/23 0701 - 07/24 0700 In: 1310 [P.O.:1310] Out: -   Weight Filed Weights   02/20/15 1620 02/21/15 0500 02/22/15 0503  Weight: 82.5 kg (181 lb 14.1 oz) 81.92 kg (180 lb 9.6 oz) 80.695 kg (177 lb 14.4 oz)    Lab Results: Basic Metabolic Panel:  Recent Labs  16/10/96 0221 02/22/15 0410  NA 136 135  K 3.9 3.9  CL 101 101  CO2 23 24  GLUCOSE 218* 186*  BUN 13 15  CREATININE 0.99 1.07   CBC:  Recent Labs  02/20/15 1213 02/20/15 1214 02/21/15 0221  WBC 10.8*  --  9.7  HGB 14.3 15.0 13.3  HCT 41.5 44.0 38.8*  MCV 93.0  --  92.8  PLT 244  --  243   Cardiac Panel (last 3 results)  Recent Labs  02/20/15 1213 02/20/15 1800 02/21/15 0221  CKTOTAL 88  --   --   CKMB 3.2  --   --   TROPONINI 0.11* 22.04* 35.08*  RELINDX RELATIVE INDEX IS INVALID  --   --     Telemetry: Sinus rhythm  Assessment/Plan:  1.  Recent ST elevation MI due to circumflex and left main occlusion 2.  Severe coronary artery disease with previous bypass grafting with jump graft of the mammary to several arteries and stenting of the native circumflex left main and distal LAD 3.  Hyperlipidemia with statin intolerance 4.  Diabetes  Recommendations:  Await echocardiogram today to evaluate ventricular function following MI.  May be discharged  home following echo.  Needs to follow-up with Dr. Nicholaus Bloom in one to 2 weeks.  Advised him not to travel out of town next week on the airplane.     Darden Palmer  MD [facility]  02/22/2015, 10:00 AM

## 2015-02-22 NOTE — Telephone Encounter (Signed)
Patient discharged today. He is out of metformin. I sent in a prescription for one month. Future refills will be with his PCP. Tereso Newcomer, PA-C   02/22/2015 4:40 PM

## 2015-02-22 NOTE — Progress Notes (Signed)
Echocardiogram 2D Echocardiogram has been performed.  Paul Levine, Paul Levine 02/22/2015, 12:33 PM

## 2015-02-23 ENCOUNTER — Encounter (HOSPITAL_COMMUNITY): Payer: Self-pay | Admitting: Cardiovascular Disease

## 2015-02-23 ENCOUNTER — Telehealth: Payer: Self-pay | Admitting: Nurse Practitioner

## 2015-02-23 NOTE — Telephone Encounter (Signed)
TCM; Dr. Landry Dyke pt

## 2015-02-23 NOTE — Telephone Encounter (Signed)
7 day TCM per [facility] B- 7/29 @ 9 w/ Di Kindle

## 2015-02-23 NOTE — Telephone Encounter (Signed)
Patient contacted regarding discharge from [facility] on 02/22/2015.  Patient understands to follow up with provider Nicolasa Ducking on 07/29 at 09:00 at [facility]. Patient understands discharge instructions? N/A Patient understands medications and regiment?N/A Patient understands to bring all medications to this visit? N/A    Leave message to call back

## 2015-02-24 MED FILL — Heparin Sodium (Porcine) 2 Unit/ML in Sodium Chloride 0.9%: INTRAMUSCULAR | Qty: 1500 | Status: AC

## 2015-02-24 NOTE — Telephone Encounter (Signed)
Follow up    Pt would like to know when he can removed bandage so he can shower Please call to discuss

## 2015-02-24 NOTE — Telephone Encounter (Signed)
New Message   Pt seen Paul Levine 02/22/15 and the pt wants to have his bandages removed so he can take a shower,  Please call pt

## 2015-02-24 NOTE — Telephone Encounter (Signed)
Patient contacted regarding discharge from [facility] on 7/24  Patient understands to follow up with provider ? 7/29 @ 9am with Ward Givens, NP  Patient understands discharge instructions? Yes, pt called wanting to know if he shower and remove bandage. Pt has not had any bleeding and there is no signs of bleeding through the dressing. Pt told he can remove bandage and shower as long as it has not been bleeding.  Pt reminded not to scrub area when showering just use antibacterial soap in shower as water runs over the area. If he is unsure of all the bruising to the area he can have C. Brion Aliment look at site on Friday. Pt agreed not additional questions at this time.  Patient understands medications and regiment? Yes   Patient understands to bring all medications to this visit? Yes

## 2015-02-27 ENCOUNTER — Ambulatory Visit (INDEPENDENT_AMBULATORY_CARE_PROVIDER_SITE_OTHER): Payer: Medicare Other | Admitting: Nurse Practitioner

## 2015-02-27 ENCOUNTER — Encounter: Payer: Self-pay | Admitting: Nurse Practitioner

## 2015-02-27 VITALS — BP 112/82 | HR 62 | Ht 67.0 in | Wt 178.0 lb

## 2015-02-27 DIAGNOSIS — I229 Subsequent ST elevation (STEMI) myocardial infarction of unspecified site: Secondary | ICD-10-CM

## 2015-02-27 DIAGNOSIS — I2510 Atherosclerotic heart disease of native coronary artery without angina pectoris: Secondary | ICD-10-CM | POA: Diagnosis not present

## 2015-02-27 DIAGNOSIS — I213 ST elevation (STEMI) myocardial infarction of unspecified site: Secondary | ICD-10-CM

## 2015-02-27 DIAGNOSIS — I10 Essential (primary) hypertension: Secondary | ICD-10-CM | POA: Diagnosis not present

## 2015-02-27 DIAGNOSIS — E785 Hyperlipidemia, unspecified: Secondary | ICD-10-CM | POA: Diagnosis not present

## 2015-02-27 DIAGNOSIS — I255 Ischemic cardiomyopathy: Secondary | ICD-10-CM

## 2015-02-27 NOTE — Patient Instructions (Addendum)
Medication Instructions:  1) Take your Metoprolol 1/2 tab in the morning and 1/2 tab in the evening.  Labwork: Lipids and LFTs in 8 weeks  Testing/Procedures: None  Follow-Up: Your physician recommends that you schedule a follow-up appointment in: 2 months with Dr. Tresa Endo or PA/NP.   Any Other Special Instructions Will Be Listed Below (If Applicable).

## 2015-02-27 NOTE — Progress Notes (Signed)
Patient Name: Paul Levine Date of Encounter: 02/27/2015  Primary Care Provider:  No primary care provider on file. Primary Cardiologist:  Bishop Limbo, MD   Chief Complaint  69 y/o male with a h/o CAD s/p recent STEMI, who presents for f/u.  Past Medical History   Past Medical History  Diagnosis Date  . Essential hypertension   . Diabetes mellitus without complication   . Renal disorder   . CAD (coronary artery disease)     a. s/p CABG x3 (LIMA->LAD, Y radial graft off of LIMA to OM3 and RPL1) ~2003;  b. 01/2015 STEMI (aVR/aVL)/PCI: LM 95/LCX 100p (3.5x15 Resolute DES covering LM/LCX), LAD 95ost/p (PTCA), 116m, 90d after LIMA insertion (2.25x32 Synergy DES), RCA 95/92m, 100d, LIMA->LAD patent, free radial comes off of LIMA and plugs into OM3 (ok) and then RPL1 (ok). 50% stenosis in graft between touchdowns. Nl EF.  [name] TIA (transient ischemic attack)   . Hyperlipidemia   . Ischemic cardiomyopathy     a.  Echo 02/22/15:   EF 35-40%, mod diff HK, WMA cannot be excluded, mod LAE, mild RVE, mild to mod RAE   Past Surgical History  Procedure Laterality Date  . Coronary artery bypass graft    . Cardiac catheterization N/A 02/20/2015    Procedure: Left Heart Cath and Coronary Angiography;  Surgeon: Lennette Bihari, MD;  Location: [facility] INVASIVE CV LAB;  Service: Cardiovascular;  Laterality: N/A;  . Cardiac catheterization N/A 02/20/2015    Procedure: Coronary Stent Intervention;  Surgeon: Lennette Bihari, MD;  Location: [facility] INVASIVE CV LAB;  Service: Cardiovascular;  Laterality: N/A;    Allergies  No Known Allergies  HPI  69 y/o male with a h/o CAD s/p CABG x 3 in 2003 in [place], [place]. He has not been followed by cardiology locally. He also has a h/o HTN and HL. He moved to [place] 2 yrs ago and had been off of all prior meds until about 2 wks ago. He was in his usual state until the morning of 7/22, when he had sudden onset of lightheadedness followed by fall and possible syncope. He  subsequently got up and noted abrasions on his hands and knees but went home where he began to experience left arm numbness and chest burning. He then presented to the Med Center @ [facility], where he was found to have ST elevation in leads aVR and aVL with significant deep reciprocal ST depression in inferior and lateral leads. A Code STEMI was activated and he was transferred to the [facility] Cath Lab where he was found to have severe LM, ostial LCX, distal LAD (after LIMA insertion), and RCA dzs (CTO). He has a LIMA graft that inserts into the distal LAD. He also has a free radial graft that anastomoses to the proximal LIMA and then inserts into the OM3 and RPL1 distally. His graft anatomy was relatively patent with only a 50% stenosis between the OM3 and RPL1 insertions of the free radial. Initially, it was felt that the distal LAD was the culprit lesion and this are was successfully treated with a 2.25 x 32 mm Synergy DES via the LIMA graft. Further evaluation of anatomy showed retrograde flow up the LCX and attention was then turned to the LM and LCX. Initially, kissing balloon angioplasty was performed (LM LCX & ostial LAD) and then the LM/ost LCX was successfully treated using a 3.5 x 15 mm Resolute DES. The ostial LAD was then treated with PTCA through the stent struts.  EF was normal but subsequent echo showed mod LV dysfxn with and EF of 35-40%.  He was maintained on ASA, brilinta, bb, arb, and high potency statin therapy and d/c'd on 7.23.   He has done well without chest pain or dyspnea. He has been ambulating some at home without limitations and is eager to participate in cardiac rehabilitation. He denies PND, orthopnea, dizziness, syncopal, edema, or early satiety. He is tolerating all his medications well.  Home Medications  Prior to Admission medications   Medication Sig Start Date End Date Taking? Authorizing Provider  aspirin 81 MG tablet Take 81 mg by mouth daily.   Yes Historical  Provider, MD  atorvastatin (LIPITOR) 80 MG tablet Take 80 mg by mouth daily.   Yes Historical Provider, MD  Coenzyme Q10 (COQ10 PO) Take 1 capsule by mouth daily.   Yes Historical Provider, MD  Cyanocobalamin (B-12 PO) Take 1 tablet by mouth daily.   Yes Historical Provider, MD  glucosamine-chondroitin 500-400 MG tablet Take 1 tablet by mouth daily.   Yes Historical Provider, MD  losartan (COZAAR) 25 MG tablet Take 25 mg by mouth daily.   Yes Historical Provider, MD  metFORMIN (GLUCOPHAGE-XR) 500 MG 24 hr tablet Take 1 tablet (500 mg total) by mouth daily with breakfast. 02/22/15  Yes Beatrice Lecher, PA-C  metoprolol (LOPRESSOR) 50 MG tablet Take 25 mg by mouth 2 (two) times daily.   Yes Historical Provider, MD  nitroGLYCERIN (NITROSTAT) 0.4 MG SL tablet Place 1 tablet (0.4 mg total) under the tongue every 5 (five) minutes as needed for chest pain. 02/22/15  Yes Ok Anis, NP  ticagrelor (BRILINTA) 90 MG TABS tablet Take 1 tablet (90 mg total) by mouth 2 (two) times daily. 02/22/15  Yes Ok Anis, NP  vitamin C (ASCORBIC ACID) 500 MG tablet Take 500 mg by mouth daily.   Yes Historical Provider, MD    Review of Systems  As above, he has been doing well since discharge.  He denies chest pain, palpitations, dyspnea, pnd, orthopnea, n, v, dizziness, syncope, edema, weight gain, or early satiety.  All other systems reviewed and are otherwise negative except as noted above.  Physical Exam  VS:  BP 112/82 mmHg  Pulse 62  Ht 5\' 7"  (1.702 m)  Wt 178 lb (80.74 kg)  BMI 27.87 kg/m2 , BMI Body mass index is 27.87 kg/(m^2). GEN: Well nourished, well developed, in no acute distress. HEENT: normal. Neck: Supple, no JVD, carotid bruits, or masses. Cardiac: RRR, no murmurs, rubs, or gallops. No clubbing, cyanosis, edema.  Radials/DP/PT 2+ and equal bilaterally. Right groin cath site without bleeding, bruit, or hematoma. Respiratory:  Respirations regular and unlabored, clear to  auscultation bilaterally. GI: Soft, nontender, nondistended, BS + x 4. MS: no deformity or atrophy. Skin: warm and dry, no rash. Neuro:  Strength and sensation are intact. Psych: Normal affect.  Accessory Clinical Findings  ECG - regular sinus rhythm, 62 lateral T flattening, no acute ST or T changes.  Assessment & Plan  1.  ST segment elevation myocardial infarction, subsequent episode of care/coronary artery disease: Patient recently presented with ST segment elevation and chest pain following a syncopal episode. He underwent catheterization revealing severe distal LAD as well as left main, ostial LAD, and ostial left circumflex disease. All areas were successfully treated with drug-eluting stents. Since discharge, he has done well without recurrence of chest pain or dyspnea. He remains on aspirin, statin, ARB, beta blocker, and Brilinta therapy and is  tolerating all well. He does plan to enroll in cardiac rehabilitation within the next couple of weeks. No changes to medical regimen today.  2. Ischemic cardiac myopathy: EF was 35-40% by echocardiogram prior to discharge. He is euvolemic on exam. He is weighing himself daily and his weight has been stable. He denies any dyspnea, PND, or orthopnea. He remains on beta blocker and ARB therapy.  We discussed the importance of daily weights, sodium restriction, medication compliance, and symptom reporting and he verbalizes understanding.   3. Essential hypertension: Blood pressure stable on beta blocker and ARB therapy.  4. Hyperlipidemia: LDL was 179 during admission with elevation of LFTs in the setting of MI. We will arrange for follow-up lipids and LFTs in about 6 weeks.  5. Type 2 diabetes mellitus: He remains on metformin therapy and this will be followed by his primary care provider.  6. Disposition: Follow-up lipids and LFTs in approximately 8 weeks. Follow-up with Dr. Tresa Endo in approximately 2 months. Follow-up echo to reevaluate LV  function in 3 months.  Nicolasa Ducking, NP 02/27/2015, 11:51 AM

## 2015-03-05 ENCOUNTER — Telehealth: Payer: Self-pay | Admitting: *Deleted

## 2015-03-05 ENCOUNTER — Telehealth: Payer: Self-pay | Admitting: Cardiovascular Disease

## 2015-03-05 NOTE — Telephone Encounter (Signed)
Pt called in stating that he is about 2 wks post stent and he is having some tingling in his left arm that comes and goes. He wanted to know if this is something he should be worried about. Please call  Thanks

## 2015-03-05 NOTE — Telephone Encounter (Signed)
Discussed with dr Tresa Endo, cont to monitor for now. Follow up scheduled  Patient voiced understanding to call if symptoms return or change.

## 2015-03-05 NOTE — Telephone Encounter (Signed)
[facility] rehab [facility] to [facility].

## 2015-03-05 NOTE — Telephone Encounter (Addendum)
Spoke with pt, today while walking he developed a sensation in his left arm like it was going to sleep. He also felt this in his back under his shoulder blade. It lasted about 3 to 5 min and went away with rest. He denies SOB or other symptoms. This is nothing like the discomfort prior to recent stenting. He has been active since then with no return of the sensation.  He is not able to reproduce the discomfort. Will discuss with dr Tresa Endo

## 2015-03-09 ENCOUNTER — Telehealth: Payer: Self-pay | Admitting: Cardiovascular Disease

## 2015-03-10 NOTE — Telephone Encounter (Signed)
Closed encounter °

## 2015-03-12 ENCOUNTER — Other Ambulatory Visit (INDEPENDENT_AMBULATORY_CARE_PROVIDER_SITE_OTHER): Payer: Medicare Other | Admitting: *Deleted

## 2015-03-12 ENCOUNTER — Other Ambulatory Visit: Payer: Self-pay | Admitting: *Deleted

## 2015-03-12 DIAGNOSIS — I10 Essential (primary) hypertension: Secondary | ICD-10-CM | POA: Diagnosis not present

## 2015-03-12 DIAGNOSIS — I2510 Atherosclerotic heart disease of native coronary artery without angina pectoris: Secondary | ICD-10-CM

## 2015-03-12 LAB — HEPATIC FUNCTION PANEL
ALT: 21 U/L (ref 0–53)
AST: 20 U/L (ref 0–37)
Albumin: 4.4 g/dL (ref 3.5–5.2)
Alkaline Phosphatase: 83 U/L (ref 39–117)
Bilirubin, Direct: 0.1 mg/dL (ref 0.0–0.3)
Total Bilirubin: 0.8 mg/dL (ref 0.2–1.2)
Total Protein: 7.4 g/dL (ref 6.0–8.3)

## 2015-03-12 LAB — LIPID PANEL
CHOL/HDL RATIO: 3
CHOLESTEROL: 137 mg/dL (ref 0–200)
HDL: 49.1 mg/dL (ref >39.00)
LDL Cholesterol: 65 mg/dL (ref 0–99)
NONHDL: 87.57
Triglycerides: 114 mg/dL (ref 0.0–149.0)
VLDL: 22.8 mg/dL (ref 0.0–40.0)

## 2015-03-12 NOTE — Addendum Note (Signed)
Addended by: Letitia Sabala K on: 03/12/2015 09:19 AM   Modules accepted: Orders  

## 2015-03-12 NOTE — Addendum Note (Signed)
Addended by: Tonita Phoenix on: 03/12/2015 09:19 AM   Modules accepted: Orders

## 2015-03-16 ENCOUNTER — Ambulatory Visit (INDEPENDENT_AMBULATORY_CARE_PROVIDER_SITE_OTHER): Payer: Medicare Other | Admitting: Cardiovascular Disease

## 2015-03-16 VITALS — BP 130/80 | HR 61 | Ht 67.0 in | Wt 176.0 lb

## 2015-03-16 DIAGNOSIS — E785 Hyperlipidemia, unspecified: Secondary | ICD-10-CM

## 2015-03-16 DIAGNOSIS — I10 Essential (primary) hypertension: Secondary | ICD-10-CM | POA: Diagnosis not present

## 2015-03-16 DIAGNOSIS — E118 Type 2 diabetes mellitus with unspecified complications: Secondary | ICD-10-CM

## 2015-03-16 DIAGNOSIS — I25810 Atherosclerosis of coronary artery bypass graft(s) without angina pectoris: Secondary | ICD-10-CM | POA: Diagnosis not present

## 2015-03-16 DIAGNOSIS — I2109 ST elevation (STEMI) myocardial infarction involving other coronary artery of anterior wall: Secondary | ICD-10-CM | POA: Diagnosis not present

## 2015-03-16 DIAGNOSIS — I255 Ischemic cardiomyopathy: Secondary | ICD-10-CM

## 2015-03-16 MED ORDER — LOSARTAN POTASSIUM 25 MG PO TABS: 25.0000 mg | ORAL_TABLET | Freq: Two times a day (BID) | ORAL | Status: DC

## 2015-03-16 NOTE — Patient Instructions (Addendum)
Your physician has requested that you have an echocardiogram. Echocardiography is a painless test that uses sound waves to create images of your heart. It provides your doctor with information about the size and shape of your heart and how well your heart's chambers and valves are working. This procedure takes approximately one hour. There are no restrictions for this procedure. This will be done in 4-6 weeks.   Your physician has recommended you make the following change in your medication: the losartan has been increased to 1 tablet twice a day.  Your physician recommends that you schedule a follow-up appointment in: 2 months with Dr. Tresa Endo.

## 2015-03-18 ENCOUNTER — Telehealth: Payer: Self-pay | Admitting: Cardiovascular Disease

## 2015-03-18 ENCOUNTER — Encounter: Payer: Self-pay | Admitting: Cardiovascular Disease

## 2015-03-18 DIAGNOSIS — E785 Hyperlipidemia, unspecified: Secondary | ICD-10-CM | POA: Insufficient documentation

## 2015-03-18 NOTE — Telephone Encounter (Signed)
Pt says he is waiting to hear something about getting in Cardiac Rehab.

## 2015-03-18 NOTE — Progress Notes (Signed)
Patient ID: Paul Levine, male   DOB: Dec 09, 1945, 69 y.o.   MRN: [id number]     HPI: Paul Levine is a 69 y.o. male who presents to the office today for a  follow up cardiology evaluation following his recent presentation with a STEMI and emergent cardiac catheterization on 02/20/2015.  Mr. Paul Levine is a very pleasant 69 year old gentleman who underwent CABG surgery 13 years ago at [facility] in [place], [place].  Over the past year.  He moved to the [place] area but had not yet established Cardiologic care.  He presented to [facility] hospital on 02/20/2015 after he developed chest pain after he had taken his wife to the airport.  He presented to [facility] where his ECG demonstrated ST elevation in aVR and aVL with significant deeper septal ST segment depression inferiorly as well as V4 through V6.  A STEMI was activated and he presented to the [facility] catheterization laboratory.  I performed a very complicated catheterization and intervention.  Initially, there were no markers for grafts, but ultimately it was found that he had a Y graft with the LIMA graft extending into his mid LAD and a radial artery and to side anastomosis sequentially supplying the distal circumflex marginal and distal RCA.  Please refer to my extensive cardiac catheterization report from 02/20/2015.  Initially, there was no flow down the circumflex vessel and it was felt that this may have been chronically occluded and that the culprit was a long 90% stenosis beyond the LIMA graft insertion.  He also had distal left main and ostial LAD disease which jeopardized the diagonal vessel.  Prior to the LAD being occluded after the diagonal vessel.  Following successful intervention.  The the LIMA graft to his distal LAD.  Reinjection into the left native coronary system now disclosed TIMI-3 flow down the circumflex which is a result was now felt to be the culprit lesion contributing to his inferolateral ST changes.  He  underwent successful stenting of his left main and circumflex with PTCA of the LAD ostium.  He had an uneventful hospital course following his 4 hour procedure and was discharged less than 48 hours later.  Since hospital discharge, he has been without chest pain.  He feels well.  He saw [name] for initial post hospital follow-up evaluation to lie 29 2016 and was doing well.  His ejection fraction was 35-40% by echo prior to discharge.  He denies chest pain.  He denies palpitations.  He has been on aspirin and Brilinta for dual antiplatelets therapy, metoprolol, tartrate 25 mg twice a day, losartan 25 mg daily, Lipitor 80 mg in addition to metformin.  Past Medical History  Diagnosis Date  . Essential hypertension   . Diabetes mellitus without complication   . Renal disorder   . CAD (coronary artery disease)     a. s/p CABG x3 (LIMA->LAD, Y radial graft off of LIMA to OM3 and RPL1) ~2003;  b. 01/2015 STEMI (aVR/aVL)/PCI: LM 95/LCX 100p (3.5x15 Resolute DES covering LM/LCX), LAD 95ost/p (PTCA), 147m 90d after LIMA insertion (2.25x32 Synergy DES), RCA 95/853m100d, LIMA->LAD patent, free radial comes off of LIMA and plugs into OM3 (ok) and then RPL1 (ok). 50% stenosis in graft between touchdowns. Nl EF.  . Marland KitchenIA (transient ischemic attack)   . Hyperlipidemia   . Ischemic cardiomyopathy     a.  Echo 02/22/15:   EF 35-40%, mod diff HK, WMA cannot be excluded, mod LAE, mild RVE, mild to mod RAE  Past Surgical History  Procedure Laterality Date  . Coronary artery bypass graft    . Cardiac catheterization N/A 02/20/2015    Procedure: Left Heart Cath and Coronary Angiography;  Surgeon: Troy Sine, MD;  Location: [facility];  Service: Cardiovascular;  Laterality: N/A;  . Cardiac catheterization N/A 02/20/2015    Procedure: Coronary Stent Intervention;  Surgeon: Troy Sine, MD;  Location: [facility];  Service: Cardiovascular;  Laterality: N/A;    No Known Allergies  Current  Outpatient Prescriptions  Medication Sig Dispense Refill  . aspirin 81 MG tablet Take 81 mg by mouth daily.    [name] atorvastatin (LIPITOR) 80 MG tablet Take 80 mg by mouth daily.    . Coenzyme Q10 (COQ10 PO) Take 1 capsule by mouth daily.    . Cyanocobalamin (B-12 PO) Take 1 tablet by mouth daily.    [name] glucosamine-chondroitin 500-400 MG tablet Take 1 tablet by mouth daily.    [name] losartan (COZAAR) 25 MG tablet Take 1 tablet (25 mg total) by mouth 2 (two) times daily. 60 tablet 6  . metFORMIN (GLUCOPHAGE-XR) 500 MG 24 hr tablet Take 1 tablet (500 mg total) by mouth daily with breakfast. 30 tablet 0  . metoprolol (LOPRESSOR) 50 MG tablet Take 25 mg by mouth 2 (two) times daily.    . nitroGLYCERIN (NITROSTAT) 0.4 MG SL tablet Place 1 tablet (0.4 mg total) under the tongue every 5 (five) minutes as needed for chest pain. 25 tablet 3  . ticagrelor (BRILINTA) 90 MG TABS tablet Take 1 tablet (90 mg total) by mouth 2 (two) times daily. 60 tablet 6  . vitamin C (ASCORBIC ACID) 500 MG tablet Take 500 mg by mouth daily.     No current facility-administered medications for this visit.    Social History   Social History  . Marital Status: Married    Spouse Name: N/A  . Number of Children: N/A  . Years of Education: N/A   Occupational History  . Orthopedic Analyst    Social History Main Topics  . Smoking status: Never Smoker   . Smokeless tobacco: Not on file  . Alcohol Use: 0.0 oz/week    0 Standard drinks or equivalent per week     Comment: very rare beer  . Drug Use: No  . Sexual Activity: Not on file   Other Topics Concern  . Not on file   Social History Narrative    Family History  Problem Relation Age of Onset  . Congestive Heart Failure Mother   . Heart failure Mother   . Heart attack Neg Hx   . Stroke Neg Hx   . Hypertension Mother   . Hypertension Sister     ROS General: Negative; No fevers, chills, or night sweats HEENT: Negative; No changes in vision or hearing, sinus  congestion, difficulty swallowing Pulmonary: Negative; No cough, wheezing, shortness of breath, hemoptysis Cardiovascular: See HPI: No chest pain, presyncope, syncope, palpatations GI: Negative; No nausea, vomiting, diarrhea, or abdominal pain GU: Negative; No dysuria, hematuria, or difficulty voiding Musculoskeletal: Negative; no myalgias, joint pain, or weakness Hematologic: Negative; no easy bruising, bleeding Endocrine: Negative; no heat/cold intolerance; no diabetes, Neuro: Negative; no changes in balance, headaches Skin: Negative; No rashes or skin lesions Psychiatric: Negative; No behavioral problems, depression Sleep: Negative; No snoring,  daytime sleepiness, hypersomnolence, bruxism, restless legs, hypnogognic hallucinations. Other comprehensive 14 point system review is negative   Physical Exam BP 130/80 mmHg  Pulse 61  Ht  _0  (1.702 m)  Wt 176 lb (79.833 kg)  BMI 27.56 kg/m2 Wt Readings from Last 3 Encounters:  03/16/15 176 lb (79.833 kg)  02/27/15 178 lb (80.74 kg)  02/22/15 177 lb 14.4 oz (80.695 kg)   General: Alert, oriented, no distress.  Skin: normal turgor, no rashes, warm and dry HEENT: Normocephalic, atraumatic. Pupils equal round and reactive to light; sclera anicteric; extraocular muscles intact, No lid lag; Nose without nasal septal hypertrophy; Mouth/Parynx benign; Mallinpatti scale 3 Neck: No JVD, no carotid bruits; normal carotid upstroke Lungs: clear to ausculatation and percussion bilaterally; no wheezing or rales, normal inspiratory and expiratory effort Chest wall: without tenderness to palpitation Heart: PMI not displaced, RRR, s1 s2 normal, 1/6 systolic murmur, No diastolic murmur, no rubs, gallops, thrills, or heaves Abdomen: soft, nontender; no hepatosplenomehaly, BS+; abdominal aorta nontender and not dilated by palpation. Back: no CVA tenderness Pulses: 2+  Musculoskeletal: full range of motion, normal strength, no joint  deformities Extremities: Pulses 2+, no clubbing cyanosis or edema, Homan's sign negative  Neurologic: grossly nonfocal; Cranial nerves grossly wnl Psychologic: Normal mood and affect   ECG (independently read by me): Normal sinus rhythm at 61 bpm.  Resolution of prior ST elevation and T-wave inversion from his initial hospital ECG.  LABS:  BMP Latest Ref Rng 02/22/2015 02/21/2015 02/20/2015  Glucose 65 - 99 mg/dL 186(H) 218(H) 367(H)  BUN 6 - 20 mg/dL 15 13 22(H)  Creatinine 0.61 - 1.24 mg/dL 1.07 0.99 1.20  Sodium 135 - 145 mmol/L 135 136 137  Potassium 3.5 - 5.1 mmol/L 3.9 3.9 4.3  Chloride 101 - 111 mmol/L 101 101 103  CO2 22 - 32 mmol/L 24 23 -  Calcium 8.9 - 10.3 mg/dL 8.6(L) 8.6(L) -     Hepatic Function Latest Ref Rng 03/12/2015 02/21/2015 02/20/2015  Total Protein 6.0 - 8.3 g/dL 7.4 6.2(L) 7.3  Albumin 3.5 - 5.2 g/dL 4.4 3.7 4.1  AST 0 - 37 U/L 20 202(H) 84(H)  ALT 0 - 53 U/L 21 69(H) 67(H)  Alk Phosphatase 39 - 117 U/L 83 70 84  Total Bilirubin 0.2 - 1.2 mg/dL 0.8 0.7 0.6  Bilirubin, Direct 0.0 - 0.3 mg/dL 0.1 0.1 -    CBC Latest Ref Rng 02/21/2015 02/20/2015 02/20/2015  WBC 4.0 - 10.5 K/uL 9.7 - 10.8(H)  Hemoglobin 13.0 - 17.0 g/dL 13.3 15.0 14.3  Hematocrit 39.0 - 52.0 % 38.8(L) 44.0 41.5  Platelets 150 - 400 K/uL 243 - 244   Lab Results  Component Value Date   MCV 92.8 02/21/2015   MCV 93.0 02/20/2015    Lab Results  Component Value Date   TSH 0.881 02/20/2015    BNP No results found for: BNP  ProBNP No results found for: PROBNP   Lipid Panel     Component Value Date/Time   CHOL 137 03/12/2015 0920   TRIG 114.0 03/12/2015 0920   HDL 49.10 03/12/2015 0920   CHOLHDL 3 03/12/2015 0920   VLDL 22.8 03/12/2015 0920   LDLCALC 65 03/12/2015 0920     RADIOLOGY: No results found.    ASSESSMENT AND PLAN: Mr. 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  He has native  mid RCA occlusion.  He underwent a very complex prolonged but ultimately successful intervention to his LAD, AV the LIMA graft, left main, ostial circumflex, and ostial LAD.  He has been chest pain-free ever since and feels well without shortness of breath, PND, orthopnea, or palpitations.  I'm now further titrating his losartan to 25 mg twice a day and he will continue at present his dose of metoprolol 25 mg twice a day.  His resting pulse is 61.  He will undergo a follow-up echo Doppler study in approximately 6 weeks.  Lipid studies are excellent with an LDL of 65, now on atorvastatin 80 mg.  He is on dual platelet therapy without bleeding and is tolerating this well.  I will see him in 2 months for follow-up evaluation following his echo Doppler for reassessment of LV function.   Troy Sine, MD, [facility]  03/18/2015 6:24 PM

## 2015-03-18 NOTE — Telephone Encounter (Signed)
Spoke to cardiac rehab - order was faxed on 8/4.  Advised them to call me if unable to locate order. Asked them to call patient if located.  Called pt & explained situation. He was very understanding. Instructed him to call for any further needs.

## 2015-03-30 ENCOUNTER — Telehealth (HOSPITAL_COMMUNITY): Payer: Self-pay | Admitting: Cardiac Rehabilitation

## 2015-03-30 NOTE — Telephone Encounter (Signed)
pc to pt to discuss enrolling in cardiac rehab.  Pt declined, he is exercising on his own and reports Dr. Tresa Endo feels program is unnecessary for him at this time.  Pt states he will contact cardiac rehab department in future if he decides to schedule.

## 2015-04-13 ENCOUNTER — Other Ambulatory Visit: Payer: Self-pay

## 2015-04-13 ENCOUNTER — Ambulatory Visit (HOSPITAL_COMMUNITY): Payer: Medicare Other | Attending: Cardiovascular Disease

## 2015-04-13 DIAGNOSIS — I2510 Atherosclerotic heart disease of native coronary artery without angina pectoris: Secondary | ICD-10-CM | POA: Insufficient documentation

## 2015-04-13 DIAGNOSIS — I517 Cardiomegaly: Secondary | ICD-10-CM | POA: Insufficient documentation

## 2015-04-13 DIAGNOSIS — I25810 Atherosclerosis of coronary artery bypass graft(s) without angina pectoris: Secondary | ICD-10-CM | POA: Diagnosis not present

## 2015-04-13 DIAGNOSIS — I10 Essential (primary) hypertension: Secondary | ICD-10-CM | POA: Insufficient documentation

## 2015-04-13 DIAGNOSIS — E785 Hyperlipidemia, unspecified: Secondary | ICD-10-CM | POA: Diagnosis not present

## 2015-04-13 DIAGNOSIS — E119 Type 2 diabetes mellitus without complications: Secondary | ICD-10-CM | POA: Insufficient documentation

## 2015-04-16 ENCOUNTER — Ambulatory Visit (INDEPENDENT_AMBULATORY_CARE_PROVIDER_SITE_OTHER): Payer: Medicare Other | Admitting: Cardiovascular Disease

## 2015-04-16 ENCOUNTER — Encounter: Payer: Self-pay | Admitting: Cardiovascular Disease

## 2015-04-16 VITALS — BP 126/76 | HR 64 | Ht 67.0 in | Wt 177.0 lb

## 2015-04-16 DIAGNOSIS — I255 Ischemic cardiomyopathy: Secondary | ICD-10-CM | POA: Diagnosis not present

## 2015-04-16 DIAGNOSIS — I2510 Atherosclerotic heart disease of native coronary artery without angina pectoris: Secondary | ICD-10-CM | POA: Diagnosis not present

## 2015-04-16 DIAGNOSIS — E785 Hyperlipidemia, unspecified: Secondary | ICD-10-CM | POA: Diagnosis not present

## 2015-04-16 DIAGNOSIS — E118 Type 2 diabetes mellitus with unspecified complications: Secondary | ICD-10-CM

## 2015-04-16 DIAGNOSIS — I2109 ST elevation (STEMI) myocardial infarction involving other coronary artery of anterior wall: Secondary | ICD-10-CM

## 2015-04-16 NOTE — Patient Instructions (Signed)
Your physician wants you to follow-up in: 6 months or sooner if needed. You will receive a reminder letter in the mail two months in advance. If you don't receive a letter, please call our office to schedule the follow-up appointment. 

## 2015-04-18 ENCOUNTER — Encounter: Payer: Self-pay | Admitting: Cardiovascular Disease

## 2015-04-18 NOTE — Progress Notes (Signed)
Patient ID: Paul Levine, male   DOB: 21-Oct-1945, 69 y.o.   MRN: [id number]     HPI: Paul Levine is a 69 y.o. male who presents to the office today for  follow up cardiology evaluation.  [name] is a very pleasant 69 year old gentleman who underwent CABG surgery 13 years ago at [facility] in [place], [place].  Last year he moved to the [place] area. He presented to [facility] hospital on 02/20/2015 after he developed chest pain after he had taken his wife to the airport.  He presented to [facility] where his ECG demonstrated ST elevation in aVR and aVL with significant deeper septal ST segment depression inferiorly as well as V4 through V6.  A STEMI was activated and he presented to the [facility] catheterization laboratory.  I performed a very complicated catheterization and intervention.  Initially, there were no markers for grafts, but ultimately it was found that he had a Y graft with the LIMA graft extending into his mid LAD and a radial artery and to side anastomosis sequentially supplying the distal circumflex marginal and distal RCA.  Please refer to my extensive cardiac catheterization report from 02/20/2015.  Initially, there was no flow down the circumflex vessel and it was felt that this may have been chronically occluded and that the culprit was a long 90% stenosis beyond the LIMA graft insertion.  He also had distal left main and ostial LAD disease which jeopardized the diagonal vessel.  Prior to the LAD being occluded after the diagonal vessel.  Following successful intervention.  The the LIMA graft to his distal LAD.  Reinjection into the left native coronary system now disclosed TIMI-3 flow down the circumflex which is a result was now felt to be the culprit lesion contributing to his inferolateral ST changes.  He underwent successful stenting of his left main and circumflex with PTCA of the LAD ostium.  He had an uneventful hospital course following his 4 hour procedure and  was discharged less than 48 hours later.  Since hospital discharge, he has been without chest pain.  He feels well.  He saw [name] for initial post hospital follow-up evaluation to lie 29 2016 and was doing well.  His ejection fraction was 35-40% by echo prior to discharge.  I saw him for initial cardiology evaluation following his hospitalization he denied chest pain or palpitations.  He has been on aspirin and Brilinta for dual antiplatelets therapy, metoprolol, tartrate 25 mg twice a day, losartan 25 mg daily, Lipitor 80 mg in addition to metformin.  I further titrated his losartan to 25 mg twice a day.  He underwent a subsequent echo Doppler study on 04/13/2015.  This now showed significant improvement of LV function with an ejection fraction of 50-55% and he had normal wall motion.  There was grade 2 diastolic dysfunction.  There was mild biatrial and right ventricular enlargement.  Estimated pulmonary systolic pressure was 33 mm.  The past month, he has continued to do well.  He is now walking greater than 10,000 steps per day.  He tells me he underwent recent blood work at the [facility].  He was recently found to have a UTI and was started on Cipro therapy.  He denies bleeding.  He denies recurrent anginal symptomatology.  He presents for evaluation.  Past Medical History  Diagnosis Date  . Essential hypertension   . Diabetes mellitus without complication   . Renal disorder   . CAD (coronary artery disease)  a. s/p CABG x3 (LIMA->LAD, Y radial graft off of LIMA to OM3 and RPL1) ~2003;  b. 01/2015 STEMI (aVR/aVL)/PCI: LM 95/LCX 100p (3.5x15 Resolute DES covering LM/LCX), LAD 95ost/p (PTCA), 159m 90d after LIMA insertion (2.25x32 Synergy DES), RCA 95/854m100d, LIMA->LAD patent, free radial comes off of LIMA and plugs into OM3 (ok) and then RPL1 (ok). 50% stenosis in graft between touchdowns. Nl EF.  . Marland KitchenIA (transient ischemic attack)   . Hyperlipidemia   . Ischemic cardiomyopathy      a.  Echo 02/22/15:   EF 35-40%, mod diff HK, WMA cannot be excluded, mod LAE, mild RVE, mild to mod RAE    Past Surgical History  Procedure Laterality Date  . Coronary artery bypass graft    . Cardiac catheterization N/A 02/20/2015    Procedure: Left Heart Cath and Coronary Angiography;  Surgeon: ThTroy SineMD;  [facility];  Service: Cardiovascular;  Laterality: N/A;  . Cardiac catheterization N/A 02/20/2015    Procedure: Coronary Stent Intervention;  Surgeon: ThTroy SineMD;  [facility];  Service: Cardiovascular;  Laterality: N/A;    No Known Allergies  Current Outpatient Prescriptions  Medication Sig Dispense Refill  . aspirin 81 MG tablet Take 81 mg by mouth daily.    . Marland Kitchentorvastatin (LIPITOR) 80 MG tablet Take 80 mg by mouth daily.    . Coenzyme Q10 (COQ10 PO) Take 1 capsule by mouth daily.    . Cyanocobalamin (B-12 PO) Take 1 tablet by mouth daily.    . Marland Kitchenlucosamine-chondroitin 500-400 MG tablet Take 1 tablet by mouth daily.    . Marland Kitchenosartan (COZAAR) 25 MG tablet Take 1 tablet (25 mg total) by mouth 2 (two) times daily. 60 tablet 6  . metFORMIN (GLUCOPHAGE-XR) 500 MG 24 hr tablet Take 1 tablet (500 mg total) by mouth daily with breakfast. 30 tablet 0  . metoprolol (LOPRESSOR) 50 MG tablet Take 25 mg by mouth 2 (two) times daily.    . nitroGLYCERIN (NITROSTAT) 0.4 MG SL tablet Place 1 tablet (0.4 mg total) under the tongue every 5 (five) minutes as needed for chest pain. 25 tablet 3  . sulfamethoxazole-trimethoprim (BACTRIM DS,SEPTRA DS) 800-160 MG per tablet Take 1 tablet by mouth 2 (two) times daily.    . ticagrelor (BRILINTA) 90 MG TABS tablet Take 1 tablet (90 mg total) by mouth 2 (two) times daily. 60 tablet 6  . vitamin C (ASCORBIC ACID) 500 MG tablet Take 500 mg by mouth daily.     No current facility-administered medications for this visit.    Social History   Social History  . Marital Status: Married    Spouse Name: N/A  . Number  of Children: N/A  . Years of Education: N/A   Occupational History  . Orthopedic Analyst    Social History Main Topics  . Smoking status: Never Smoker   . Smokeless tobacco: Not on file  . Alcohol Use: 0.0 oz/week    0 Standard drinks or equivalent per week     Comment: very rare beer  . Drug Use: No  . Sexual Activity: Not on file   Other Topics Concern  . Not on file   Social History Narrative    Family History  Problem Relation Age of Onset  . Congestive Heart Failure Mother   . Heart failure Mother   . Heart attack Neg Hx   . Stroke Neg Hx   . Hypertension Mother   . Hypertension Sister  ROS General: Negative; No fevers, chills, or night sweats HEENT: Negative; No changes in vision or hearing, sinus congestion, difficulty swallowing Pulmonary: Negative; No cough, wheezing, shortness of breath, hemoptysis Cardiovascular: See HPI: No chest pain, presyncope, syncope, palpatations GI: Negative; No nausea, vomiting, diarrhea, or abdominal pain GU: Negative; No dysuria, hematuria, or difficulty voiding Musculoskeletal: Negative; no myalgias, joint pain, or weakness Hematologic: Negative; no easy bruising, bleeding Endocrine: Negative; no heat/cold intolerance; no diabetes, Neuro: Negative; no changes in balance, headaches Skin: Negative; No rashes or skin lesions Psychiatric: Negative; No behavioral problems, depression Sleep: Negative; No snoring,  daytime sleepiness, hypersomnolence, bruxism, restless legs, hypnogognic hallucinations. Other comprehensive 14 point system review is negative   Physical Exam BP 126/76 mmHg  Pulse 64  Ht 5' 7"  (1.702 m)  Wt 177 lb (80.287 kg)  BMI 27.72 kg/m2 Wt Readings from Last 3 Encounters:  04/16/15 177 lb (80.287 kg)  03/16/15 176 lb (79.833 kg)  02/27/15 178 lb (80.74 kg)   General: Alert, oriented, no distress.  Skin: normal turgor, no rashes, warm and dry HEENT: Normocephalic, atraumatic. Pupils equal round and  reactive to light; sclera anicteric; extraocular muscles intact, No lid lag; Nose without nasal septal hypertrophy; Mouth/Parynx benign; Mallinpatti scale 3 Neck: No JVD, no carotid bruits; normal carotid upstroke Lungs: clear to ausculatation and percussion bilaterally; no wheezing or rales, normal inspiratory and expiratory effort Chest wall: without tenderness to palpitation Heart: PMI not displaced, RRR, s1 s2 normal, 1/6 systolic murmur, No diastolic murmur, no rubs, gallops, thrills, or heaves Abdomen: soft, nontender; no hepatosplenomehaly, BS+; abdominal aorta nontender and not dilated by palpation. Back: no CVA tenderness Pulses: 2+  Musculoskeletal: full range of motion, normal strength, no joint deformities Extremities: Pulses 2+, no clubbing cyanosis or edema, Homan's sign negative  Neurologic: grossly nonfocal; Cranial nerves grossly wnl Psychologic: Normal mood and affect  ECG (independently read by me): Normal sinus rhythm at 64 bpm.  No ECG evidence for infarction.  Normal intervals.   ECG (independently read by me): Normal sinus rhythm at 61 bpm.  Resolution of prior ST elevation and T-wave inversion from his initial hospital ECG.  LABS:  BMP Latest Ref Rng 02/22/2015 02/21/2015 02/20/2015  Glucose 65 - 99 mg/dL 186(H) 218(H) 367(H)  BUN 6 - 20 mg/dL 15 13 22(H)  Creatinine 0.61 - 1.24 mg/dL 1.07 0.99 1.20  Sodium 135 - 145 mmol/L 135 136 137  Potassium 3.5 - 5.1 mmol/L 3.9 3.9 4.3  Chloride 101 - 111 mmol/L 101 101 103  CO2 22 - 32 mmol/L 24 23 -  Calcium 8.9 - 10.3 mg/dL 8.6(L) 8.6(L) -     Hepatic Function Latest Ref Rng 03/12/2015 02/21/2015 02/20/2015  Total Protein 6.0 - 8.3 g/dL 7.4 6.2(L) 7.3  Albumin 3.5 - 5.2 g/dL 4.4 3.7 4.1  AST 0 - 37 U/L 20 202(H) 84(H)  ALT 0 - 53 U/L 21 69(H) 67(H)  Alk Phosphatase 39 - 117 U/L 83 70 84  Total Bilirubin 0.2 - 1.2 mg/dL 0.8 0.7 0.6  Bilirubin, Direct 0.0 - 0.3 mg/dL 0.1 0.1 -    CBC Latest Ref Rng 02/21/2015  02/20/2015 02/20/2015  WBC 4.0 - 10.5 K/uL 9.7 - 10.8(H)  Hemoglobin 13.0 - 17.0 g/dL 13.3 15.0 14.3  Hematocrit 39.0 - 52.0 % 38.8(L) 44.0 41.5  Platelets 150 - 400 K/uL 243 - 244   Lab Results  Component Value Date   MCV 92.8 02/21/2015   MCV 93.0 02/20/2015    Lab Results  Component  Value Date   TSH 0.881 02/20/2015    BNP No results found for: BNP  ProBNP No results found for: PROBNP   Lipid Panel     Component Value Date/Time   CHOL 137 03/12/2015 0920   TRIG 114.0 03/12/2015 0920   HDL 49.10 03/12/2015 0920   CHOLHDL 3 03/12/2015 0920   VLDL 22.8 03/12/2015 0920   LDLCALC 65 03/12/2015 0920     RADIOLOGY: No results found.    ASSESSMENT AND PLAN: Mr. 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  He has native mid RCA occlusion.  He underwent a very complex prolonged but ultimately successful intervention to his LAD, AV the LIMA graft, left main, ostial circumflex, and ostial LAD.  He has been chest pain-free ever since and feels well without shortness of breath, PND, orthopnea, or palpitations.  He has tolerated the increase losartan dose.  His blood pressure today is excellent.  I thoroughly reviewed his echo Doppler study which shows complete normalization of LV function without wall motion abnormality.  His lipids are aggressively treated and is tolerating atorvastatin 80 mg without myalgias.  He is exercising daily.  I will try to obtain his most recent blood work done at the [facility].  We discussed his exposure to [place] with reference to his ischemic heart disease.  I will see him in 6 months for cardiology reevaluation.  Time spent: 25  minutes  Troy Sine, MD, [facility]  04/18/2015 10:22 AM

## 2015-05-29 ENCOUNTER — Ambulatory Visit: Payer: Medicare Other | Admitting: Cardiovascular Disease

## 2015-07-23 ENCOUNTER — Telehealth: Payer: Self-pay | Admitting: *Deleted

## 2015-07-23 NOTE — Telephone Encounter (Signed)
Faxed cardiac rehab order to [facility]. 

## 2015-07-30 ENCOUNTER — Encounter (HOSPITAL_COMMUNITY)
Admission: RE | Admit: 2015-07-30 | Discharge: 2015-07-30 | Disposition: A | Discharge status: Home / Self Care | Payer: Medicare Other | Source: Ambulatory Visit | Attending: Cardiovascular Disease | Admitting: Cardiovascular Disease

## 2015-07-30 NOTE — Progress Notes (Signed)
Cardiac Rehab Medication Review by a Pharmacist  Does the patient  feel that his/her medications are working for him/her?  yes  Has the patient been experiencing any side effects to the medications prescribed?  yes Some upset stomach  Does the patient measure his/her own blood pressure or blood glucose at home?  yes - periodically  Does the patient have any problems obtaining medications due to transportation or finances?   no  Understanding of regimen: good Understanding of indications: good Potential of compliance: good    Pharmacist comments: Paul Levine has a good understanding of his medications and does not report issues obtaining [facility] medications through the [facility].   Paul Levine, PharmD Clinical Pharmacy Resident Pager # [phone] 07/30/2015 8:25 AM

## 2015-08-05 ENCOUNTER — Encounter (HOSPITAL_COMMUNITY)
Admission: RE | Admit: 2015-08-05 | Discharge: 2015-08-05 | Disposition: A | Discharge status: Home / Self Care | Payer: Medicare Other | Source: Ambulatory Visit | Attending: Cardiovascular Disease | Admitting: Cardiovascular Disease

## 2015-08-05 DIAGNOSIS — I213 ST elevation (STEMI) myocardial infarction of unspecified site: Secondary | ICD-10-CM | POA: Diagnosis not present

## 2015-08-05 LAB — GLUCOSE, CAPILLARY
GLUCOSE-CAPILLARY: 119 mg/dL — AB (ref 65–99)
Glucose-Capillary: 152 mg/dL — ABNORMAL HIGH (ref 65–99)

## 2015-08-05 NOTE — Progress Notes (Signed)
Pt started exercise today at the 9:45 Phase II cardiac rehab program. Pt with VA coverage with health net at a non VA facility/program.  Pt tolerated light exercise without difficulty. VSS, telemetry-SR with rare PVC's, asymptomatic.  Medication list reconciled.  Pt verbalized compliance with medications and denies barriers to compliance. PSYCHOSOCIAL ASSESSMENT:  PHQ-0. Pt exhibits positive coping skills, hopeful outlook with supportive family. Pt and wife are both now retired and hope to begin doing some traveling. No psychosocial needs identified at this time, no psychosocial interventions necessary.    Pt enjoys hiking,history buff, volunteering and spending time with grand kids.  Pt remarked that he was happy to see the volunteers in our cardiac rehab program were former patients.  He feels having the volunteers bring value to the program.  Pt completed [place] cardiac rehab program [place] state after his bypass surgery 14 years ago.[name].   Pt cardiac rehab  goal is  to know his limits.  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  Pt encouraged to participate in educational classes and home exercise consultation to increase ability to achieve these goals.   Pt long term cardiac rehab goal is develop future or long term exercise and be active with grand kids.  Pt presently walks in his neighborhood about 15-20 minutes a day. Will perform periodic check in with pt to assess his progress toward meeting these goals.  Pt oriented to exercise equipment and routine.  Understanding verbalized. Alanson Alyarlette Carlton RN, BSN

## 2015-08-07 ENCOUNTER — Encounter (HOSPITAL_COMMUNITY)
Admission: RE | Admit: 2015-08-07 | Discharge: 2015-08-07 | Disposition: A | Discharge status: Home / Self Care | Payer: Medicare Other | Source: Ambulatory Visit | Attending: Cardiovascular Disease | Admitting: Cardiovascular Disease

## 2015-08-07 DIAGNOSIS — I213 ST elevation (STEMI) myocardial infarction of unspecified site: Secondary | ICD-10-CM | POA: Diagnosis not present

## 2015-08-07 LAB — GLUCOSE, CAPILLARY
GLUCOSE-CAPILLARY: 138 mg/dL — AB (ref 65–99)
GLUCOSE-CAPILLARY: 114 mg/dL — AB (ref 65–99)

## 2015-08-10 ENCOUNTER — Encounter (HOSPITAL_COMMUNITY)
Admission: RE | Admit: 2015-08-10 | Discharge: 2015-08-10 | Disposition: A | Discharge status: Home / Self Care | Payer: Medicare Other | Source: Ambulatory Visit | Attending: Cardiovascular Disease | Admitting: Cardiovascular Disease

## 2015-08-10 DIAGNOSIS — I213 ST elevation (STEMI) myocardial infarction of unspecified site: Secondary | ICD-10-CM | POA: Diagnosis not present

## 2015-08-10 LAB — GLUCOSE, CAPILLARY
GLUCOSE-CAPILLARY: 136 mg/dL — AB (ref 65–99)
Glucose-Capillary: 194 mg/dL — ABNORMAL HIGH (ref 65–99)

## 2015-08-12 ENCOUNTER — Encounter (HOSPITAL_COMMUNITY): Payer: Medicare Other

## 2015-08-14 ENCOUNTER — Encounter (HOSPITAL_COMMUNITY)
Admission: RE | Admit: 2015-08-14 | Discharge: 2015-08-14 | Disposition: A | Discharge status: Home / Self Care | Payer: Medicare Other | Source: Ambulatory Visit | Attending: Cardiovascular Disease | Admitting: Cardiovascular Disease

## 2015-08-14 DIAGNOSIS — I213 ST elevation (STEMI) myocardial infarction of unspecified site: Secondary | ICD-10-CM | POA: Diagnosis not present

## 2015-08-17 ENCOUNTER — Encounter (HOSPITAL_COMMUNITY): Payer: Medicare Other

## 2015-08-19 ENCOUNTER — Encounter (HOSPITAL_COMMUNITY): Payer: Medicare Other

## 2015-08-21 ENCOUNTER — Encounter (HOSPITAL_COMMUNITY)
Admission: RE | Admit: 2015-08-21 | Discharge: 2015-08-21 | Disposition: A | Discharge status: Home / Self Care | Payer: Medicare Other | Source: Ambulatory Visit | Attending: Cardiovascular Disease | Admitting: Cardiovascular Disease

## 2015-08-21 DIAGNOSIS — I213 ST elevation (STEMI) myocardial infarction of unspecified site: Secondary | ICD-10-CM | POA: Diagnosis not present

## 2015-08-21 LAB — GLUCOSE, CAPILLARY: GLUCOSE-CAPILLARY: 145 mg/dL — AB (ref 65–99)

## 2015-08-21 NOTE — Progress Notes (Signed)
Reviewed home exercise with pt today.  Pt plans to continue to walk at home and use stationary bike for exercise.  Pt is also considering join [employer] close to home for exericse, but did not know where they were located.  Pt provided map of local [employer] for him.  Reviewed THR, pulse, RPE, sign and symptoms, NTG use, and when to call 911 or MD.  Pt voiced understanding. Fabio Pierce, MA, ACSM RCEP

## 2015-08-24 ENCOUNTER — Encounter (HOSPITAL_COMMUNITY)
Admission: RE | Admit: 2015-08-24 | Discharge: 2015-08-24 | Disposition: A | Discharge status: Home / Self Care | Payer: Medicare Other | Source: Ambulatory Visit | Attending: Cardiovascular Disease | Admitting: Cardiovascular Disease

## 2015-08-24 ENCOUNTER — Telehealth: Payer: Self-pay | Admitting: Cardiovascular Disease

## 2015-08-24 DIAGNOSIS — I213 ST elevation (STEMI) myocardial infarction of unspecified site: Secondary | ICD-10-CM | POA: Diagnosis not present

## 2015-08-24 NOTE — Telephone Encounter (Signed)
Pt is returning the nurse's call. Please call back  Thanks

## 2015-08-24 NOTE — Telephone Encounter (Signed)
Returned call to patient lab results given. 

## 2015-08-26 ENCOUNTER — Encounter (HOSPITAL_COMMUNITY)
Admission: RE | Admit: 2015-08-26 | Discharge: 2015-08-26 | Disposition: A | Discharge status: Home / Self Care | Payer: Medicare Other | Source: Ambulatory Visit | Attending: Cardiovascular Disease | Admitting: Cardiovascular Disease

## 2015-08-26 DIAGNOSIS — I213 ST elevation (STEMI) myocardial infarction of unspecified site: Secondary | ICD-10-CM | POA: Diagnosis not present

## 2015-08-28 ENCOUNTER — Encounter (HOSPITAL_COMMUNITY)
Admission: RE | Admit: 2015-08-28 | Discharge: 2015-08-28 | Disposition: A | Discharge status: Home / Self Care | Payer: Medicare Other | Source: Ambulatory Visit | Attending: Cardiovascular Disease | Admitting: Cardiovascular Disease

## 2015-08-28 DIAGNOSIS — I213 ST elevation (STEMI) myocardial infarction of unspecified site: Secondary | ICD-10-CM | POA: Diagnosis not present

## 2015-08-31 ENCOUNTER — Encounter (HOSPITAL_COMMUNITY)
Admission: RE | Admit: 2015-08-31 | Discharge: 2015-08-31 | Disposition: A | Discharge status: Home / Self Care | Payer: Medicare Other | Source: Ambulatory Visit | Attending: Cardiovascular Disease | Admitting: Cardiovascular Disease

## 2015-08-31 DIAGNOSIS — I213 ST elevation (STEMI) myocardial infarction of unspecified site: Secondary | ICD-10-CM | POA: Diagnosis not present

## 2015-08-31 NOTE — Progress Notes (Signed)
Paul Levine 70 y.o. male Nutrition Note Spoke with pt. Nutrition Plan and Nutrition Survey goals reviewed with pt. Pt is following Step 2 of the Therapeutic Lifestyle Changes diet. Pt is diabetic. Last A1c noted was 7.6 on 04/07/15, which is significantly improved from 02/20/15 A1c. Most recent A1c indicates blood glucose not optimally controlled. This Probation officer went over Diabetes Education test results. Pt checks CBG's 1-2 times a day. One hour post-prandial  CBG's reportedly "in the 180's." Pt expressed understanding of the information reviewed. Pt aware of nutrition education classes offered and plans on attending nutrition classes.  Lab Results  Component Value Date   HGBA1C 9.0* 02/20/2015    Nutrition Diagnosis ? Food-and nutrition-related knowledge deficit related to lack of exposure to information as related to diagnosis of: ? CVD ? DM ? Overweight related to excessive energy intake as evidenced by a BMI of 28.3  Nutrition RX/ Estimated Daily Nutrition Needs for: wt loss 1300-1800 Kcal, 35-45 gm fat, 8-12 gm sat fat, 1.2-1.8 gm trans-fat, <1500 mg sodium, 150-175 gm CHO   Nutrition Intervention ? Pt's individual nutrition plan reviewed with pt. ? Benefits of adopting Therapeutic Lifestyle Changes discussed when Medficts reviewed. ? Pt to attend the Portion Distortion class ? Pt to attend the Diabetes Q & A class - met 08/14/15 ? Pt to attend the   ? Nutrition I class                  ? Nutrition II class - met; 08/11/15     ? Diabetes Blitz class  ? Continue client-centered nutrition education by RD, as part of interdisciplinary care. Goal(s) ? Pt to identify food quantities necessary to achieve: ? wt loss to a goal wt loss of 6-24 lb (2.7-10.9 kg) at graduation from cardiac rehab.  ? CBG concentrations in the normal range or as close to normal as is safely possible. Monitor and Evaluate progress toward nutrition goal with team. Nutrition Risk: Change to Moderate Derek Mound, M.Ed,  RD, LDN, CDE 08/31/2015 10:33 AM

## 2015-09-02 ENCOUNTER — Encounter (HOSPITAL_COMMUNITY)
Admission: RE | Admit: 2015-09-02 | Discharge: 2015-09-02 | Disposition: A | Discharge status: Home / Self Care | Payer: Medicare Other | Source: Ambulatory Visit | Attending: Cardiovascular Disease | Admitting: Cardiovascular Disease

## 2015-09-02 DIAGNOSIS — I213 ST elevation (STEMI) myocardial infarction of unspecified site: Secondary | ICD-10-CM | POA: Diagnosis not present

## 2015-09-02 NOTE — Progress Notes (Signed)
Patient reports experiencing some dizziness at home when changing form a sitting to a standing position. Paul Levine also reports feeling dizzy when he bends over. Orthostatic blood pressures checked. Lying blood pressure 134/75 heart rate 61. Sitting blood pressure 135/74 heart 66. Standing blood pressure 133/76 heart rate 67. Paul Levine has not experienced any dizziness at cardiac rehab.Will fax exercise flow sheets to Dr. Tresa Endo and the cardiologist  Office at the [facility] for review.

## 2015-09-04 ENCOUNTER — Encounter (HOSPITAL_COMMUNITY): Payer: Medicare Other

## 2015-09-07 ENCOUNTER — Encounter (HOSPITAL_COMMUNITY)
Admission: RE | Admit: 2015-09-07 | Discharge: 2015-09-07 | Disposition: A | Discharge status: Home / Self Care | Payer: Medicare Other | Source: Ambulatory Visit | Attending: Cardiovascular Disease | Admitting: Cardiovascular Disease

## 2015-09-07 DIAGNOSIS — I213 ST elevation (STEMI) myocardial infarction of unspecified site: Secondary | ICD-10-CM | POA: Diagnosis not present

## 2015-09-09 ENCOUNTER — Encounter (HOSPITAL_COMMUNITY)
Admission: RE | Admit: 2015-09-09 | Discharge: 2015-09-09 | Disposition: A | Discharge status: Home / Self Care | Payer: Medicare Other | Source: Ambulatory Visit | Attending: Cardiovascular Disease | Admitting: Cardiovascular Disease

## 2015-09-09 DIAGNOSIS — I213 ST elevation (STEMI) myocardial infarction of unspecified site: Secondary | ICD-10-CM | POA: Diagnosis not present

## 2015-09-11 ENCOUNTER — Encounter (HOSPITAL_COMMUNITY)
Admission: RE | Admit: 2015-09-11 | Discharge: 2015-09-11 | Disposition: A | Discharge status: Home / Self Care | Payer: Medicare Other | Source: Ambulatory Visit | Attending: Cardiovascular Disease | Admitting: Cardiovascular Disease

## 2015-09-11 DIAGNOSIS — I213 ST elevation (STEMI) myocardial infarction of unspecified site: Secondary | ICD-10-CM | POA: Diagnosis not present

## 2015-09-14 ENCOUNTER — Encounter (HOSPITAL_COMMUNITY)
Admission: RE | Admit: 2015-09-14 | Discharge: 2015-09-14 | Disposition: A | Discharge status: Home / Self Care | Payer: Medicare Other | Source: Ambulatory Visit | Attending: Cardiovascular Disease | Admitting: Cardiovascular Disease

## 2015-09-14 DIAGNOSIS — I213 ST elevation (STEMI) myocardial infarction of unspecified site: Secondary | ICD-10-CM | POA: Diagnosis not present

## 2015-09-15 ENCOUNTER — Encounter: Payer: Self-pay | Admitting: Cardiovascular Disease

## 2015-09-16 ENCOUNTER — Encounter (HOSPITAL_COMMUNITY)
Admission: RE | Admit: 2015-09-16 | Discharge: 2015-09-16 | Disposition: A | Discharge status: Home / Self Care | Payer: Medicare Other | Source: Ambulatory Visit | Attending: Cardiovascular Disease | Admitting: Cardiovascular Disease

## 2015-09-16 DIAGNOSIS — I213 ST elevation (STEMI) myocardial infarction of unspecified site: Secondary | ICD-10-CM | POA: Diagnosis not present

## 2015-09-18 ENCOUNTER — Encounter (HOSPITAL_COMMUNITY)
Admission: RE | Admit: 2015-09-18 | Discharge: 2015-09-18 | Disposition: A | Discharge status: Home / Self Care | Payer: Medicare Other | Source: Ambulatory Visit | Attending: Cardiovascular Disease | Admitting: Cardiovascular Disease

## 2015-09-18 DIAGNOSIS — I213 ST elevation (STEMI) myocardial infarction of unspecified site: Secondary | ICD-10-CM | POA: Diagnosis not present

## 2015-09-21 ENCOUNTER — Encounter (HOSPITAL_COMMUNITY)
Admission: RE | Admit: 2015-09-21 | Discharge: 2015-09-21 | Disposition: A | Discharge status: Home / Self Care | Payer: Medicare Other | Source: Ambulatory Visit | Attending: Cardiovascular Disease | Admitting: Cardiovascular Disease

## 2015-09-21 DIAGNOSIS — I213 ST elevation (STEMI) myocardial infarction of unspecified site: Secondary | ICD-10-CM | POA: Diagnosis not present

## 2015-09-21 LAB — GLUCOSE, CAPILLARY: GLUCOSE-CAPILLARY: 190 mg/dL — AB (ref 65–99)

## 2015-09-23 ENCOUNTER — Encounter (HOSPITAL_COMMUNITY)
Admission: RE | Admit: 2015-09-23 | Discharge: 2015-09-23 | Disposition: A | Discharge status: Home / Self Care | Payer: Medicare Other | Source: Ambulatory Visit | Attending: Cardiovascular Disease | Admitting: Cardiovascular Disease

## 2015-09-23 DIAGNOSIS — I213 ST elevation (STEMI) myocardial infarction of unspecified site: Secondary | ICD-10-CM | POA: Diagnosis not present

## 2015-09-25 ENCOUNTER — Encounter (HOSPITAL_COMMUNITY)
Admission: RE | Admit: 2015-09-25 | Discharge: 2015-09-25 | Disposition: A | Discharge status: Home / Self Care | Payer: Medicare Other | Source: Ambulatory Visit | Attending: Cardiovascular Disease | Admitting: Cardiovascular Disease

## 2015-09-25 DIAGNOSIS — I213 ST elevation (STEMI) myocardial infarction of unspecified site: Secondary | ICD-10-CM | POA: Diagnosis not present

## 2015-09-28 ENCOUNTER — Encounter (HOSPITAL_COMMUNITY)
Admission: RE | Admit: 2015-09-28 | Discharge: 2015-09-28 | Disposition: A | Discharge status: Home / Self Care | Payer: Medicare Other | Source: Ambulatory Visit | Attending: Cardiovascular Disease | Admitting: Cardiovascular Disease

## 2015-09-28 DIAGNOSIS — I213 ST elevation (STEMI) myocardial infarction of unspecified site: Secondary | ICD-10-CM | POA: Diagnosis not present

## 2015-09-30 ENCOUNTER — Encounter (HOSPITAL_COMMUNITY)
Admission: RE | Admit: 2015-09-30 | Discharge: 2015-09-30 | Disposition: A | Discharge status: Home / Self Care | Payer: Medicare Other | Source: Ambulatory Visit | Attending: Cardiovascular Disease | Admitting: Cardiovascular Disease

## 2015-09-30 DIAGNOSIS — I213 ST elevation (STEMI) myocardial infarction of unspecified site: Secondary | ICD-10-CM | POA: Insufficient documentation

## 2015-10-02 ENCOUNTER — Encounter (HOSPITAL_COMMUNITY)
Admission: RE | Admit: 2015-10-02 | Discharge: 2015-10-02 | Disposition: A | Discharge status: Home / Self Care | Payer: Medicare Other | Source: Ambulatory Visit | Attending: Cardiovascular Disease | Admitting: Cardiovascular Disease

## 2015-10-02 DIAGNOSIS — I213 ST elevation (STEMI) myocardial infarction of unspecified site: Secondary | ICD-10-CM | POA: Diagnosis not present

## 2015-10-05 ENCOUNTER — Encounter (HOSPITAL_COMMUNITY)
Admission: RE | Admit: 2015-10-05 | Discharge: 2015-10-05 | Disposition: A | Discharge status: Home / Self Care | Payer: Medicare Other | Source: Ambulatory Visit | Attending: Cardiovascular Disease | Admitting: Cardiovascular Disease

## 2015-10-05 DIAGNOSIS — I213 ST elevation (STEMI) myocardial infarction of unspecified site: Secondary | ICD-10-CM | POA: Diagnosis not present

## 2015-10-07 ENCOUNTER — Encounter (HOSPITAL_COMMUNITY)
Admission: RE | Admit: 2015-10-07 | Discharge: 2015-10-07 | Disposition: A | Discharge status: Home / Self Care | Payer: Medicare Other | Source: Ambulatory Visit | Attending: Cardiovascular Disease | Admitting: Cardiovascular Disease

## 2015-10-07 DIAGNOSIS — I213 ST elevation (STEMI) myocardial infarction of unspecified site: Secondary | ICD-10-CM | POA: Diagnosis not present

## 2015-10-09 ENCOUNTER — Encounter (HOSPITAL_COMMUNITY)
Admission: RE | Admit: 2015-10-09 | Discharge: 2015-10-09 | Disposition: A | Discharge status: Home / Self Care | Payer: Medicare Other | Source: Ambulatory Visit | Attending: Cardiovascular Disease | Admitting: Cardiovascular Disease

## 2015-10-09 DIAGNOSIS — I213 ST elevation (STEMI) myocardial infarction of unspecified site: Secondary | ICD-10-CM | POA: Diagnosis not present

## 2015-10-12 ENCOUNTER — Encounter (HOSPITAL_COMMUNITY)
Admission: RE | Admit: 2015-10-12 | Discharge: 2015-10-12 | Disposition: A | Discharge status: Home / Self Care | Payer: Medicare Other | Source: Ambulatory Visit | Attending: Cardiovascular Disease | Admitting: Cardiovascular Disease

## 2015-10-12 DIAGNOSIS — I213 ST elevation (STEMI) myocardial infarction of unspecified site: Secondary | ICD-10-CM | POA: Diagnosis not present

## 2015-10-14 ENCOUNTER — Encounter (HOSPITAL_COMMUNITY)
Admission: RE | Admit: 2015-10-14 | Discharge: 2015-10-14 | Disposition: A | Discharge status: Home / Self Care | Payer: Medicare Other | Source: Ambulatory Visit | Attending: Cardiovascular Disease | Admitting: Cardiovascular Disease

## 2015-10-14 DIAGNOSIS — I213 ST elevation (STEMI) myocardial infarction of unspecified site: Secondary | ICD-10-CM | POA: Diagnosis not present

## 2015-10-16 ENCOUNTER — Encounter (HOSPITAL_COMMUNITY)
Admission: RE | Admit: 2015-10-16 | Discharge: 2015-10-16 | Disposition: A | Discharge status: Home / Self Care | Payer: Medicare Other | Source: Ambulatory Visit | Attending: Cardiovascular Disease | Admitting: Cardiovascular Disease

## 2015-10-16 DIAGNOSIS — I213 ST elevation (STEMI) myocardial infarction of unspecified site: Secondary | ICD-10-CM | POA: Diagnosis not present

## 2015-10-16 LAB — GLUCOSE, CAPILLARY: Glucose-Capillary: 151 mg/dL — ABNORMAL HIGH (ref 65–99)

## 2015-10-19 ENCOUNTER — Encounter (HOSPITAL_COMMUNITY)
Admission: RE | Admit: 2015-10-19 | Discharge: 2015-10-19 | Disposition: A | Discharge status: Home / Self Care | Payer: Medicare Other | Source: Ambulatory Visit | Attending: Cardiovascular Disease | Admitting: Cardiovascular Disease

## 2015-10-19 DIAGNOSIS — I213 ST elevation (STEMI) myocardial infarction of unspecified site: Secondary | ICD-10-CM | POA: Diagnosis not present

## 2015-10-21 ENCOUNTER — Encounter (HOSPITAL_COMMUNITY)
Admission: RE | Admit: 2015-10-21 | Discharge: 2015-10-21 | Disposition: A | Discharge status: Home / Self Care | Payer: Medicare Other | Source: Ambulatory Visit | Attending: Cardiovascular Disease | Admitting: Cardiovascular Disease

## 2015-10-21 DIAGNOSIS — I213 ST elevation (STEMI) myocardial infarction of unspecified site: Secondary | ICD-10-CM | POA: Diagnosis not present

## 2015-10-21 LAB — GLUCOSE, CAPILLARY: GLUCOSE-CAPILLARY: 92 mg/dL (ref 65–99)

## 2015-10-23 ENCOUNTER — Encounter (HOSPITAL_COMMUNITY)
Admission: RE | Admit: 2015-10-23 | Discharge: 2015-10-23 | Disposition: A | Discharge status: Home / Self Care | Payer: Medicare Other | Source: Ambulatory Visit | Attending: Cardiovascular Disease | Admitting: Cardiovascular Disease

## 2015-10-23 DIAGNOSIS — I213 ST elevation (STEMI) myocardial infarction of unspecified site: Secondary | ICD-10-CM | POA: Diagnosis not present

## 2015-10-26 ENCOUNTER — Encounter (HOSPITAL_COMMUNITY)
Admission: RE | Admit: 2015-10-26 | Discharge: 2015-10-26 | Disposition: A | Discharge status: Home / Self Care | Payer: Medicare Other | Source: Ambulatory Visit | Attending: Cardiovascular Disease | Admitting: Cardiovascular Disease

## 2015-10-26 DIAGNOSIS — I213 ST elevation (STEMI) myocardial infarction of unspecified site: Secondary | ICD-10-CM | POA: Diagnosis not present

## 2015-10-28 ENCOUNTER — Encounter (HOSPITAL_COMMUNITY)
Admission: RE | Admit: 2015-10-28 | Discharge: 2015-10-28 | Disposition: A | Discharge status: Home / Self Care | Payer: Medicare Other | Source: Ambulatory Visit | Attending: Cardiovascular Disease | Admitting: Cardiovascular Disease

## 2015-10-28 DIAGNOSIS — I213 ST elevation (STEMI) myocardial infarction of unspecified site: Secondary | ICD-10-CM | POA: Diagnosis not present

## 2015-10-30 ENCOUNTER — Encounter (HOSPITAL_COMMUNITY)
Admission: RE | Admit: 2015-10-30 | Discharge: 2015-10-30 | Disposition: A | Discharge status: Home / Self Care | Payer: Medicare Other | Source: Ambulatory Visit | Attending: Cardiovascular Disease | Admitting: Cardiovascular Disease

## 2015-10-30 DIAGNOSIS — I213 ST elevation (STEMI) myocardial infarction of unspecified site: Secondary | ICD-10-CM | POA: Diagnosis not present

## 2015-11-02 ENCOUNTER — Encounter (HOSPITAL_COMMUNITY)
Admission: RE | Admit: 2015-11-02 | Discharge: 2015-11-02 | Disposition: A | Discharge status: Home / Self Care | Payer: Medicare Other | Source: Ambulatory Visit | Attending: Cardiovascular Disease | Admitting: Cardiovascular Disease

## 2015-11-02 DIAGNOSIS — I213 ST elevation (STEMI) myocardial infarction of unspecified site: Secondary | ICD-10-CM | POA: Insufficient documentation

## 2015-11-03 ENCOUNTER — Encounter: Payer: Self-pay | Admitting: Cardiovascular Disease

## 2015-11-03 ENCOUNTER — Ambulatory Visit (INDEPENDENT_AMBULATORY_CARE_PROVIDER_SITE_OTHER): Payer: Medicare Other | Admitting: Cardiovascular Disease

## 2015-11-03 VITALS — BP 130/84 | HR 68 | Ht 66.0 in | Wt 176.6 lb

## 2015-11-03 DIAGNOSIS — G4733 Obstructive sleep apnea (adult) (pediatric): Secondary | ICD-10-CM

## 2015-11-03 DIAGNOSIS — E785 Hyperlipidemia, unspecified: Secondary | ICD-10-CM

## 2015-11-03 DIAGNOSIS — I25810 Atherosclerosis of coronary artery bypass graft(s) without angina pectoris: Secondary | ICD-10-CM

## 2015-11-03 DIAGNOSIS — I10 Essential (primary) hypertension: Secondary | ICD-10-CM

## 2015-11-03 DIAGNOSIS — E118 Type 2 diabetes mellitus with unspecified complications: Secondary | ICD-10-CM

## 2015-11-03 DIAGNOSIS — Z9989 Dependence on other enabling machines and devices: Secondary | ICD-10-CM

## 2015-11-03 NOTE — Patient Instructions (Signed)
Your physician wants you to follow-up in: 6 months or sooner if needed. You will receive a reminder letter in the mail two months in advance. If you don't receive a letter, please call our office to schedule the follow-up appointment.   If you need a refill on your cardiac medications before your next appointment, please call your pharmacy. 

## 2015-11-04 ENCOUNTER — Encounter (HOSPITAL_COMMUNITY)
Admission: RE | Admit: 2015-11-04 | Discharge: 2015-11-04 | Disposition: A | Discharge status: Home / Self Care | Payer: Medicare Other | Source: Ambulatory Visit | Attending: Cardiovascular Disease | Admitting: Cardiovascular Disease

## 2015-11-04 ENCOUNTER — Encounter: Payer: Self-pay | Admitting: Cardiovascular Disease

## 2015-11-04 DIAGNOSIS — Z9989 Dependence on other enabling machines and devices: Secondary | ICD-10-CM

## 2015-11-04 DIAGNOSIS — G4733 Obstructive sleep apnea (adult) (pediatric): Secondary | ICD-10-CM | POA: Insufficient documentation

## 2015-11-04 DIAGNOSIS — I213 ST elevation (STEMI) myocardial infarction of unspecified site: Secondary | ICD-10-CM | POA: Diagnosis not present

## 2015-11-04 NOTE — Progress Notes (Signed)
Pt graduated from cardiac rehab program today with completion of 36 exercise sessions in Phase II. Pt maintained good attendance and progressed nicely during his participation in rehab as evidenced by increased MET level.   Medication list reconciled. Repeat  PHQ score- 0 .  Pt has made significant lifestyle changes and should be commended for his success. Pt feels he has achieved his goals during cardiac rehab.  Clair Gulling has a fit bit and he tires to get 12,000 steps a day. Jim plans to continue exercise at the [facility].

## 2015-11-04 NOTE — Progress Notes (Signed)
Patient ID: Paul Levine, male   DOB: 11-12-1945, 70 y.o.   MRN: [id number]     HPI: Paul Levine is a 70 y.o. male who presents to the office today for a 7 month follow up cardiology evaluation.  Paul Levine is a very pleasant 70 year old gentleman who underwent CABG surgery 14 years ago at [facility] in [place], [place].  Last year he moved to the [place] area. He presented to [facility] hospital on 02/20/2015 after he developed chest pain after he had taken his wife to the airport.  He presented to [facility] where his ECG demonstrated ST elevation in aVR and aVL with significant deeper septal ST segment depression inferiorly as well as V4 through V6.  A STEMI was activated and he presented to the [facility] catheterization laboratory.  I performed a very complicated catheterization and intervention.  Initially, there were no markers for grafts, but ultimately it was found that he had a Y graft with the LIMA graft extending into his mid LAD and a radial artery and to side anastomosis sequentially supplying the distal circumflex marginal and distal RCA.  Please refer to my extensive cardiac catheterization report from 02/20/2015.  Initially, there was no flow down the circumflex vessel and it was felt that this may have been chronically occluded and that the culprit was a long 90% stenosis beyond the LIMA graft insertion.  He also had distal left main and ostial LAD disease which jeopardized the diagonal vessel.  Prior to the LAD being occluded after the diagonal vessel.  Following successful intervention.  The the LIMA graft to his distal LAD.  Reinjection into the left native coronary system now disclosed TIMI-3 flow down the circumflex which is a result was now felt to be the culprit lesion contributing to his inferolateral ST changes.  He underwent successful stenting of his left main and circumflex with PTCA of the LAD ostium.  He had an uneventful hospital course following his 4 hour  procedure and was discharged less than 48 hours later.  Since hospital discharge, he has been without chest pain.  He feels well.  He saw [name] for initial post hospital follow-up evaluation to lie 29 2016 and was doing well.  His ejection fraction was 35-40% by echo prior to discharge.  I saw him for initial cardiology evaluation following his hospitalization he denied chest pain or palpitations.  He has been on aspirin and Brilinta for dual antiplatelets therapy, metoprolol tartrate 25 mg twice a day, losartan 25 mg daily, Lipitor 80 mg in addition to metformin.  I further titrated his losartan to 25 mg twice a day.  A subsequent echo Doppler study on 04/13/2015 showed significant improvement of LV function with an ejection fraction of 50-55% and he had normal wall motion.  There was grade 2 diastolic dysfunction.  There was mild biatrial and right ventricular enlargement.  Estimated pulmonary systolic pressure was 33 mm.  Since I last saw him he has continued to do well.  [facility].  He will be undergoing carotid duplex imaging suite.  He is now walking greater than 15,000 steps per day.  He has a  history of OSA  and recently received a new CPAP machine after having had a sleep study at the [facility].  He had been participating in the cardiac rehabilitation program.  He feels well.  He denies recurrent chest pain.  He presents for cardiology reevaluation.  Past Medical History  Diagnosis Date  . Essential hypertension   . Diabetes mellitus without complication ([place])   . Renal disorder   . CAD (coronary artery disease)     a. s/p CABG x3 (LIMA->LAD, Y radial graft off of LIMA to OM3 and RPL1) ~2003;  b. 01/2015 STEMI (aVR/aVL)/PCI: LM 95/LCX 100p (3.5x15 Resolute DES covering LM/LCX), LAD 95ost/p (PTCA), 118m 90d after LIMA insertion (2.25x32 Synergy DES), RCA 95/817m100d, LIMA->LAD patent, free radial comes off of LIMA and plugs  into OM3 (ok) and then RPL1 (ok). 50% stenosis in graft between touchdowns. Nl EF.  . Marland KitchenIA (transient ischemic attack)   . Hyperlipidemia   . Ischemic cardiomyopathy     a.  Echo 02/22/15:   EF 35-40%, mod diff HK, WMA cannot be excluded, mod LAE, mild RVE, mild to mod RAE    Past Surgical History  Procedure Laterality Date  . Coronary artery bypass graft    . Cardiac catheterization N/A 02/20/2015    Procedure: Left Heart Cath and Coronary Angiography;  Surgeon: ThTroy SineMD;  [facility];  Service: Cardiovascular;  Laterality: N/A;  . Cardiac catheterization N/A 02/20/2015    Procedure: Coronary Stent Intervention;  Surgeon: ThTroy SineMD;  [facility];  Service: Cardiovascular;  Laterality: N/A;    No Known Allergies  Current Outpatient Prescriptions  Medication Sig Dispense Refill  . aspirin 81 MG tablet Take 81 mg by mouth daily.    . Marland Kitchentorvastatin (LIPITOR) 80 MG tablet Take 80 mg by mouth daily.    . Coenzyme Q10 (COQ10 PO) Take 1 capsule by mouth daily.    . Cyanocobalamin (B-12 PO) Take 1 tablet by mouth daily.    . Marland KitchenlipiZIDE (GLUCOTROL) 5 MG tablet Take 2.5 mg by mouth 2 (two) times daily before a meal. 1/2 tablet (2.5) twice a day 30 minutes prior to meals    . glucosamine-chondroitin 500-400 MG tablet Take 1 tablet by mouth daily.    . Marland Kitchenosartan (COZAAR) 25 MG tablet Take 1 tablet (25 mg total) by mouth 2 (two) times daily. 60 tablet 6  . metoprolol (LOPRESSOR) 50 MG tablet Take 25 mg by mouth 2 (two) times daily.    . nitroGLYCERIN (NITROSTAT) 0.4 MG SL tablet Place 1 tablet (0.4 mg total) under the tongue every 5 (five) minutes as needed for chest pain. 25 tablet 3  . ticagrelor (BRILINTA) 90 MG TABS tablet Take 1 tablet (90 mg total) by mouth 2 (two) times daily. 60 tablet 6  . vitamin C (ASCORBIC ACID) 500 MG tablet Take 500 mg by mouth daily.     No current facility-administered medications for this visit.    Social History    Social History  . Marital Status: Married    Spouse Name: N/A  . Number of Children: N/A  . Years of Education: N/A   Occupational History  . Orthopedic Analyst    Social History Main Topics  . Smoking status: Never Smoker   . Smokeless tobacco: Not on file  . Alcohol Use: 0.0 oz/week    0 Standard drinks or equivalent per week     Comment: very rare beer  . Drug Use: No  . Sexual Activity: Not on file   Other Topics Concern  . Not on file   Social History Narrative    Family History  Problem Relation Age of Onset  . Congestive Heart Failure Mother   . Heart failure Mother   .  Heart attack Neg Hx   . Stroke Neg Hx   . Hypertension Mother   . Hypertension Sister     ROS General: Negative; No fevers, chills, or night sweats HEENT: Negative; No changes in vision or hearing, sinus congestion, difficulty swallowing Pulmonary: Negative; No cough, wheezing, shortness of breath, hemoptysis Cardiovascular: See HPI: No chest pain, presyncope, syncope, palpatations GI: Negative; No nausea, vomiting, diarrhea, or abdominal pain GU: Negative; No dysuria, hematuria, or difficulty voiding Musculoskeletal: Negative; no myalgias, joint pain, or weakness Hematologic: Negative; no easy bruising, bleeding Endocrine: Negative; no heat/cold intolerance; no diabetes, Neuro: Negative; no changes in balance, headaches Skin: Negative; No rashes or skin lesions Psychiatric: Negative; No behavioral problems, depression Sleep: OSA on CPAP, recently received a new ResMed CPAP unit. Other comprehensive 14 point system review is negative   Physical Exam BP 130/84 mmHg  Pulse 68  Ht _0  (1.676 m)  Wt 176 lb 9.6 oz (80.105 kg)  BMI 28.52 kg/m2 Wt Readings from Last 3 Encounters:  11/03/15 176 lb 9.6 oz (80.105 kg)  07/30/15 177 lb 14.6 oz (80.7 kg)  04/16/15 177 lb (80.287 kg)   General: Alert, oriented, no distress.  Skin: normal turgor, no rashes, warm and dry HEENT:  Normocephalic, atraumatic. Pupils equal round and reactive to light; sclera anicteric; extraocular muscles intact, No lid lag; Nose without nasal septal hypertrophy; Mouth/Parynx benign; Mallinpatti scale 3 Neck: No JVD, no carotid bruits; normal carotid upstroke Lungs: clear to ausculatation and percussion bilaterally; no wheezing or rales, normal inspiratory and expiratory effort Chest wall: without tenderness to palpitation Heart: PMI not displaced, RRR, s1 s2 normal, 1/6 systolic murmur, No diastolic murmur, no rubs, gallops, thrills, or heaves Abdomen: soft, nontender; no hepatosplenomehaly, BS+; abdominal aorta nontender and not dilated by palpation. Back: no CVA tenderness Pulses: 2+  Musculoskeletal: full range of motion, normal strength, no joint deformities Extremities: Pulses 2+, no clubbing cyanosis or edema, Homan's sign negative  Neurologic: grossly nonfocal; Cranial nerves grossly wnl Psychologic: Normal mood and affect  ECG (independently read by me): Normal sinus rhythm at 64 bpm.  No ECG evidence for infarction.  Normal intervals.   ECG (independently read by me): Normal sinus rhythm at 61 bpm.  Resolution of prior ST elevation and T-wave inversion from his initial hospital ECG.  LABS:  BMP Latest Ref Rng 02/22/2015 02/21/2015 02/20/2015  Glucose 65 - 99 mg/dL 186(H) 218(H) 367(H)  BUN 6 - 20 mg/dL 15 13 22(H)  Creatinine 0.61 - 1.24 mg/dL 1.07 0.99 1.20  Sodium 135 - 145 mmol/L 135 136 137  Potassium 3.5 - 5.1 mmol/L 3.9 3.9 4.3  Chloride 101 - 111 mmol/L 101 101 103  CO2 22 - 32 mmol/L 24 23 -  Calcium 8.9 - 10.3 mg/dL 8.6(L) 8.6(L) -     Hepatic Function Latest Ref Rng 03/12/2015 02/21/2015 02/20/2015  Total Protein 6.0 - 8.3 g/dL 7.4 6.2(L) 7.3  Albumin 3.5 - 5.2 g/dL 4.4 3.7 4.1  AST 0 - 37 U/L 20 202(H) 84(H)  ALT 0 - 53 U/L 21 69(H) 67(H)  Alk Phosphatase 39 - 117 U/L 83 70 84  Total Bilirubin 0.2 - 1.2 mg/dL 0.8 0.7 0.6  Bilirubin, Direct 0.0 - 0.3 mg/dL 0.1  0.1 -    CBC Latest Ref Rng 02/21/2015 02/20/2015 02/20/2015  WBC 4.0 - 10.5 K/uL 9.7 - 10.8(H)  Hemoglobin 13.0 - 17.0 g/dL 13.3 15.0 14.3  Hematocrit 39.0 - 52.0 % 38.8(L) 44.0 41.5  Platelets 150 - 400 K/uL  243 - 244   Lab Results  Component Value Date   MCV 92.8 02/21/2015   MCV 93.0 02/20/2015    Lab Results  Component Value Date   TSH 0.881 02/20/2015    BNP No results found for: BNP  ProBNP No results found for: PROBNP   Lipid Panel     Component Value Date/Time   CHOL 137 03/12/2015 0920   TRIG 114.0 03/12/2015 0920   HDL 49.10 03/12/2015 0920   CHOLHDL 3 03/12/2015 0920   VLDL 22.8 03/12/2015 0920   LDLCALC 65 03/12/2015 0920     RADIOLOGY: No results found.    ASSESSMENT AND PLAN: Mr. 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  He has native mid RCA occlusion.  He underwent a very complex prolonged but ultimately successful intervention to his LAD, AV the LIMA graft, left main, ostial circumflex, and ostial LAD.  Since his complex intervention, he has continued to feel exceptionally well.  His LV function has normalized.  He has not had any palpitations or recurrent anginal symptoms.  He is walking daily.  While he was in [place] he had agent orange exposure.  He is under treatment at the [facility], and he has told them that he will continue to see me as his cardiologist.  He continues to be on losartan and metoprolol for CAD, and blood pressure.  His blood pressure today is well controlled.  He is on atorvastatin 80 mg and lipid studies in August revealed an LDL at target at 65.  There is no bleeding on dual antiplatelet therapy.  He is using CPAP with 100%  compliance. He is diabetic on glipizide 5 mg twice a day.  He will be having follow-up blood work at the [facility].  I will last that they send them to me for my review.  I will see him in 6 months for cardiology evaluation.  Time spent: 25 minutes  Troy Sine, MD, [facility]  11/04/2015 5:02 PM

## 2015-11-06 ENCOUNTER — Encounter (HOSPITAL_COMMUNITY): Payer: Medicare Other

## 2015-11-09 ENCOUNTER — Encounter (HOSPITAL_COMMUNITY): Payer: Medicare Other

## 2020-02-04 ENCOUNTER — Observation Stay (HOSPITAL_COMMUNITY): Payer: No Typology Code available for payment source

## 2020-02-04 ENCOUNTER — Emergency Department (HOSPITAL_COMMUNITY): Payer: No Typology Code available for payment source

## 2020-02-04 ENCOUNTER — Observation Stay (HOSPITAL_COMMUNITY): Payer: No Typology Code available for payment source | Admitting: Certified Registered Nurse Anesthetist

## 2020-02-04 ENCOUNTER — Encounter (HOSPITAL_COMMUNITY)
Admission: EM | Disposition: A | Discharge status: Home / Self Care | Payer: Self-pay | Source: Home / Self Care | Attending: Emergency Medicine

## 2020-02-04 ENCOUNTER — Encounter (HOSPITAL_COMMUNITY): Payer: Self-pay | Admitting: Emergency Medicine

## 2020-02-04 ENCOUNTER — Observation Stay (HOSPITAL_COMMUNITY)
Admission: EM | Admit: 2020-02-04 | Discharge: 2020-02-04 | Disposition: A | Discharge status: Home / Self Care | Payer: No Typology Code available for payment source | Attending: Urology | Admitting: Urology

## 2020-02-04 DIAGNOSIS — Z79899 Other long term (current) drug therapy: Secondary | ICD-10-CM | POA: Insufficient documentation

## 2020-02-04 DIAGNOSIS — I10 Essential (primary) hypertension: Secondary | ICD-10-CM | POA: Insufficient documentation

## 2020-02-04 DIAGNOSIS — Z7984 Long term (current) use of oral hypoglycemic drugs: Secondary | ICD-10-CM | POA: Insufficient documentation

## 2020-02-04 DIAGNOSIS — N202 Calculus of kidney with calculus of ureter: Secondary | ICD-10-CM

## 2020-02-04 DIAGNOSIS — Z419 Encounter for procedure for purposes other than remedying health state, unspecified: Secondary | ICD-10-CM

## 2020-02-04 DIAGNOSIS — N179 Acute kidney failure, unspecified: Secondary | ICD-10-CM | POA: Diagnosis not present

## 2020-02-04 DIAGNOSIS — Z7982 Long term (current) use of aspirin: Secondary | ICD-10-CM | POA: Insufficient documentation

## 2020-02-04 DIAGNOSIS — E109 Type 1 diabetes mellitus without complications: Secondary | ICD-10-CM | POA: Diagnosis not present

## 2020-02-04 DIAGNOSIS — Z7902 Long term (current) use of antithrombotics/antiplatelets: Secondary | ICD-10-CM | POA: Insufficient documentation

## 2020-02-04 DIAGNOSIS — Z20822 Contact with and (suspected) exposure to covid-19: Secondary | ICD-10-CM | POA: Diagnosis not present

## 2020-02-04 DIAGNOSIS — N209 Urinary calculus, unspecified: Secondary | ICD-10-CM | POA: Diagnosis present

## 2020-02-04 DIAGNOSIS — N201 Calculus of ureter: Secondary | ICD-10-CM | POA: Diagnosis not present

## 2020-02-04 HISTORY — PX: CYSTOSCOPY W/ URETERAL STENT PLACEMENT: SHX1429

## 2020-02-04 LAB — BASIC METABOLIC PANEL
Anion gap: 12 (ref 5–15)
BUN: 26 mg/dL — ABNORMAL HIGH (ref 8–23)
CO2: 23 mmol/L (ref 22–32)
Calcium: 9.1 mg/dL (ref 8.9–10.3)
Chloride: 102 mmol/L (ref 98–111)
Creatinine, Ser: 1.93 mg/dL — ABNORMAL HIGH (ref 0.61–1.24)
GFR calc Af Amer: 39 mL/min — ABNORMAL LOW (ref >60)
GFR calc non Af Amer: 34 mL/min — ABNORMAL LOW (ref >60)
Glucose, Bld: 155 mg/dL — ABNORMAL HIGH (ref 70–99)
Potassium: 4.4 mmol/L (ref 3.5–5.1)
Sodium: 137 mmol/L (ref 135–145)

## 2020-02-04 LAB — CBC
HCT: 44.3 % (ref 39.0–52.0)
Hemoglobin: 14.8 g/dL (ref 13.0–17.0)
MCH: 32.4 pg (ref 26.0–34.0)
MCHC: 33.4 g/dL (ref 30.0–36.0)
MCV: 96.9 fL (ref 80.0–100.0)
Platelets: 260 10*3/uL (ref 150–400)
RBC: 4.57 MIL/uL (ref 4.22–5.81)
RDW: 13.4 % (ref 11.5–15.5)
WBC: 9 10*3/uL (ref 4.0–10.5)
nRBC: 0 % (ref 0.0–0.2)

## 2020-02-04 LAB — GLUCOSE, CAPILLARY
Glucose-Capillary: 85 mg/dL (ref 70–99)
Glucose-Capillary: 80 mg/dL (ref 70–99)

## 2020-02-04 LAB — URINALYSIS, ROUTINE W REFLEX MICROSCOPIC
Bacteria, UA: NONE SEEN
Bilirubin Urine: NEGATIVE
Glucose, UA: NEGATIVE mg/dL
Hgb urine dipstick: NEGATIVE
Ketones, ur: 5 mg/dL — AB
Leukocytes,Ua: NEGATIVE
Nitrite: NEGATIVE
Protein, ur: 30 mg/dL — AB
Specific Gravity, Urine: 1.018 (ref 1.005–1.030)
pH: 5 (ref 5.0–8.0)

## 2020-02-04 LAB — SARS CORONAVIRUS 2 BY RT PCR (HOSPITAL ORDER, PERFORMED IN ~~LOC~~ HOSPITAL LAB): SARS Coronavirus 2: NEGATIVE

## 2020-02-04 SURGERY — CYSTOSCOPY, WITH RETROGRADE PYELOGRAM AND URETERAL STENT INSERTION
Anesthesia: General | Laterality: Right

## 2020-02-04 MED ORDER — CHLORHEXIDINE GLUCONATE 0.12 % MT SOLN: 15.0000 mL | Freq: Once | OROMUCOSAL | Status: AC

## 2020-02-04 MED ORDER — TRAMADOL HCL 50 MG PO TABS: 50.0000 mg | ORAL_TABLET | Freq: Four times a day (QID) | ORAL | 0 refills | Status: DC | PRN

## 2020-02-04 MED ORDER — WATER FOR IRRIGATION, STERILE IR SOLN
Status: DC | PRN
  Administered 2020-02-04: 1000 mL

## 2020-02-04 MED ORDER — IOHEXOL 300 MG/ML  SOLN
INTRAMUSCULAR | Status: DC | PRN
  Administered 2020-02-04: 3 mL

## 2020-02-04 MED ORDER — SODIUM CHLORIDE 0.9 % IV BOLUS
1000.0000 mL | Freq: Once | INTRAVENOUS | Status: AC
  Administered 2020-02-04: 1000 mL via INTRAVENOUS

## 2020-02-04 MED ORDER — SODIUM CHLORIDE 0.9 % IV SOLN: INTRAVENOUS | Status: DC

## 2020-02-04 MED ORDER — CHLORHEXIDINE GLUCONATE 0.12 % MT SOLN
OROMUCOSAL | Status: AC
  Administered 2020-02-04: 15 mL via OROMUCOSAL
  Filled 2020-02-04: qty 15

## 2020-02-04 MED ORDER — LIDOCAINE HCL (CARDIAC) PF 100 MG/5ML IV SOSY
PREFILLED_SYRINGE | INTRAVENOUS | Status: DC | PRN
  Administered 2020-02-04: 60 mg via INTRAVENOUS

## 2020-02-04 MED ORDER — SODIUM CHLORIDE 0.45 % IV SOLN: INTRAVENOUS | Status: DC

## 2020-02-04 MED ORDER — ACETAMINOPHEN 500 MG PO TABS
500.0000 mg | ORAL_TABLET | Freq: Once | ORAL | Status: AC
Start: 2020-02-04 — End: 2020-02-04
  Administered 2020-02-04: 500 mg via ORAL
  Filled 2020-02-04: qty 1

## 2020-02-04 MED ORDER — FENTANYL CITRATE (PF) 100 MCG/2ML IJ SOLN
INTRAMUSCULAR | Status: DC | PRN
  Administered 2020-02-04: 50 ug via INTRAVENOUS

## 2020-02-04 MED ORDER — 0.9 % SODIUM CHLORIDE (POUR BTL) OPTIME
TOPICAL | Status: DC | PRN
  Administered 2020-02-04: 1000 mL

## 2020-02-04 MED ORDER — EPHEDRINE SULFATE-NACL 50-0.9 MG/10ML-% IV SOSY
PREFILLED_SYRINGE | INTRAVENOUS | Status: DC | PRN
  Administered 2020-02-04 (×2): 10 mg via INTRAVENOUS

## 2020-02-04 MED ORDER — SODIUM CHLORIDE 0.9 % IV SOLN
INTRAVENOUS | Status: DC | PRN
Start: 2020-02-04 — End: 2020-02-04

## 2020-02-04 MED ORDER — ACETAMINOPHEN 10 MG/ML IV SOLN
1000.0000 mg | Freq: Four times a day (QID) | INTRAVENOUS | Status: DC
  Administered 2020-02-04: 1000 mg via INTRAVENOUS
  Filled 2020-02-04 (×2): qty 100

## 2020-02-04 MED ORDER — KETOROLAC TROMETHAMINE 15 MG/ML IJ SOLN
15.0000 mg | Freq: Four times a day (QID) | INTRAMUSCULAR | Status: DC
  Administered 2020-02-04: 15 mg via INTRAVENOUS
  Filled 2020-02-04: qty 1

## 2020-02-04 MED ORDER — FENTANYL CITRATE (PF) 100 MCG/2ML IJ SOLN: 25.0000 ug | INTRAMUSCULAR | Status: DC | PRN

## 2020-02-04 MED ORDER — FENTANYL CITRATE (PF) 250 MCG/5ML IJ SOLN
INTRAMUSCULAR | Status: AC
  Filled 2020-02-04: qty 5

## 2020-02-04 MED ORDER — PROPOFOL 10 MG/ML IV BOLUS
INTRAVENOUS | Status: DC | PRN
  Administered 2020-02-04: 130 mg via INTRAVENOUS

## 2020-02-04 MED ORDER — ONDANSETRON HCL 4 MG/2ML IJ SOLN
INTRAMUSCULAR | Status: DC | PRN
  Administered 2020-02-04: 4 mg via INTRAVENOUS

## 2020-02-04 MED ORDER — DEXAMETHASONE SODIUM PHOSPHATE 4 MG/ML IJ SOLN
INTRAMUSCULAR | Status: DC | PRN
  Administered 2020-02-04: 10 mg via INTRAVENOUS

## 2020-02-04 MED ORDER — HYDROMORPHONE HCL 1 MG/ML IJ SOLN
0.5000 mg | Freq: Once | INTRAMUSCULAR | Status: AC
  Administered 2020-02-04: 0.5 mg via INTRAVENOUS
  Filled 2020-02-04: qty 1

## 2020-02-04 SURGICAL SUPPLY — 22 items
BAG URINE DRAIN 2000ML AR STRL (UROLOGICAL SUPPLIES) ×2 IMPLANT
BAG URO CATCHER STRL LF (MISCELLANEOUS) ×2 IMPLANT
CATH FOLEY 2WAY SLVR  5CC 16FR (CATHETERS)
CATH FOLEY 2WAY SLVR 5CC 16FR (CATHETERS) IMPLANT
CATH INTERMIT  6FR 70CM (CATHETERS) ×2 IMPLANT
GLOVE BIO SURGEON STRL SZ8 (GLOVE) ×2 IMPLANT
GOWN STRL REUS W/ TWL LRG LVL3 (GOWN DISPOSABLE) ×1 IMPLANT
GOWN STRL REUS W/ TWL XL LVL3 (GOWN DISPOSABLE) ×1 IMPLANT
GOWN STRL REUS W/TWL LRG LVL3 (GOWN DISPOSABLE) ×1
GOWN STRL REUS W/TWL XL LVL3 (GOWN DISPOSABLE) ×1
GUIDEWIRE ANG ZIPWIRE 038X150 (WIRE) ×2 IMPLANT
GUIDEWIRE STR DUAL SENSOR (WIRE) ×2 IMPLANT
KIT TURNOVER KIT B (KITS) ×2 IMPLANT
MANIFOLD NEPTUNE II (INSTRUMENTS) ×2 IMPLANT
NS IRRIG 1000ML POUR BTL (IV SOLUTION) ×2 IMPLANT
PACK CYSTO (CUSTOM PROCEDURE TRAY) ×2 IMPLANT
STENT URET 6FRX24 CONTOUR (STENTS) IMPLANT
STENT URET 6FRX26 CONTOUR (STENTS) ×2 IMPLANT
SYPHON OMNI JUG (MISCELLANEOUS) ×2 IMPLANT
TOWEL GREEN STERILE FF (TOWEL DISPOSABLE) ×2 IMPLANT
TUBE CONNECTING 12X1/4 (SUCTIONS) ×2 IMPLANT
WATER STERILE IRR 3000ML UROMA (IV SOLUTION) ×2 IMPLANT

## 2020-02-04 NOTE — Transfer of Care (Signed)
Immediate Anesthesia Transfer of Care Note  Patient: Paul Levine  Procedure(s) Performed: CYSTOSCOPY WITH RETROGRADE PYELOGRAM/URETERAL STENT PLACEMENT (Right )  Patient Location: PACU and Cath Lab  Anesthesia Type:General  Level of Consciousness: awake, alert  and oriented  Airway & Oxygen Therapy: Patient Spontanous Breathing  Post-op Assessment: Report given to RN and Post -op Vital signs reviewed and stable  Post vital signs: Reviewed and stable  Last Vitals:  Vitals Value Taken Time  BP 154/84 02/04/20 1726  Temp    Pulse 72 02/04/20 1728  Resp 16 02/04/20 1728  SpO2 98 % 02/04/20 1728  Vitals shown include unvalidated device data.  Last Pain:  Vitals:   02/04/20 1416  TempSrc:   PainSc: 0-No pain         Complications: No complications documented.

## 2020-02-04 NOTE — Op Note (Signed)
Preoperative diagnosis:  1. Large right obstructing ureteral stone  Postoperative diagnosis:  1. same   Procedure:  1. Cystoscopy 2. right ureteral stent placement 3. right retrograde pyelography with interpretation   Surgeon: Crist Fat, MD  Anesthesia: General  Complications: None  Intraoperative findings:  right retrograde pyelography demonstrated a filling defect within the right ureter consistent with the patient's known calculus without other abnormalities.  EBL: Minimal  Specimens: None  Indication: Paul Levine is a 74 y.o. patient with severe right sided renal colic found to have a large obstructing ureteral stone. After reviewing the management options for treatment, he elected to proceed with the above surgical procedure(s). We have discussed the potential benefits and risks of the procedure, side effects of the proposed treatment, the likelihood of the patient achieving the goals of the procedure, and any potential problems that might occur during the procedure or recuperation. Informed consent has been obtained.  Description of procedure:  The patient was taken to the operating room and general anesthesia was induced.  The patient was placed in the dorsal lithotomy position, prepped and draped in the usual sterile fashion, and preoperative antibiotics were administered. A preoperative time-out was performed.   Cystourethroscopy was performed.  The patient's urethra was examined and was normal and demonstrated bilobar prostatic hypertrophy. The bladder was then systematically examined in its entirety. There was no evidence for any bladder tumors, stones, or other mucosal pathology.    Attention then turned to the rightureteral orifice and a ureteral catheter was used to intubate the ureteral orifice.  Omnipaque contrast was injected through the ureteral catheter and a retrograde pyelogram was performed with findings as dictated above.  A 0.38 sensor guidewire  was then advanced up the right ureter into the renal pelvis under fluoroscopic guidance.  The wire was then backloaded through the cystoscope and a ureteral stent was advance over the wire using Seldinger technique.  The stent was positioned appropriately under fluoroscopic and cystoscopic guidance.  The wire was then removed with an adequate stent curl noted in the renal pelvis as well as in the bladder.  The bladder was then emptied and the procedure ended.  The patient appeared to tolerate the procedure well and without complications.  The patient was able to be awakened and transferred to the recovery unit in satisfactory condition.    Crist Fat, M.D.

## 2020-02-04 NOTE — Discharge Instructions (Signed)
DISCHARGE INSTRUCTIONS FOR KIDNEY STONE/URETERAL STENT   MEDICATIONS:  1. Resume all your other meds from home - except do not take any extra narcotic pain meds that you may have at home.  2. Tramadol is for moderate/severe pain, otherwise taking upto 1000 mg every 6 hours of plainTylenol will help treat your pain.     ACTIVITY:  1. No strenuous activity x 1week  2. No driving while on narcotic pain medications  3. Drink plenty of water  4. Continue to walk at home - you can still get blood clots when you are at home, so keep active, but don't over do it.  5. May return to work/school tomorrow or when you feel ready   BATHING:  1. You can shower and we recommend daily showers    SIGNS/SYMPTOMS TO CALL:  Please call us if you have a fever greater than 101.5, uncontrolled nausea/vomiting, uncontrolled pain, dizziness, unable to urinate, bloody urine, chest pain, shortness of breath, leg swelling, leg pain, redness around wound, drainage from wound, or any other concerns or questions.   You can reach [place] at [phone].   FOLLOW-UP:  1. You will be scheduled for f/u in 2 weeks with Dr. Marlou Porch to discuss surgical options.

## 2020-02-04 NOTE — Consult Note (Signed)
Patient with large right ureteral stone and additional stones along the ureter.  He is afebrile but severe pain.  Plan to admit for observation and proceed to the OR this afternoon for a stent.  He should remain NPO until after his procedure.

## 2020-02-04 NOTE — ED Provider Notes (Signed)
[facility] EMERGENCY DEPARTMENT Provider Note   CSN: [id number] Arrival date & time: 02/04/20  0102     History Chief Complaint  Patient presents with  . Flank Pain    Paul Levine is a 74 y.o. male history includes CAD, diabetes, hypertension, hyperlipidemia, ischemic cardiomyopathy, kidney stones, TIA.  Patient receives majority of his care through the [facility].  Patient presents today for right flank pain that began while he was visiting [place] around 5-6 days ago.  He describes a constant moderate intensity sharp aching pain which does not radiate, no clear aggravating or alleviating factors.  He reports pain has not significantly worsened over the past several days but since it has remained so constant he presented for evaluation.  He reports this feels very similar to previous kidney stone pain.  Denies fever/chills, headache, nausea/vomiting, diarrhea, dysuria/hematuria, testicular pain/swelling, diarrhea, fall/injury, numbness/weakness, tingling or any additional concerns. HPI     Past Medical History:  Diagnosis Date  . CAD (coronary artery disease)    a. s/p CABG x3 (LIMA->LAD, Y radial graft off of LIMA to OM3 and RPL1) ~2003;  b. 01/2015 STEMI (aVR/aVL)/PCI: LM 95/LCX 100p (3.5x15 Resolute DES covering LM/LCX), LAD 95ost/p (PTCA), 169m, 90d after LIMA insertion (2.25x32 Synergy DES), RCA 95/27m, 100d, LIMA->LAD patent, free radial comes off of LIMA and plugs into OM3 (ok) and then RPL1 (ok). 50% stenosis in graft between touchdowns. Nl EF.  . Diabetes mellitus without complication (HCC)   . Essential hypertension   . Hyperlipidemia   . Ischemic cardiomyopathy    a.  Echo 02/22/15:   EF 35-40%, mod diff HK, WMA cannot be excluded, mod LAE, mild RVE, mild to mod RAE  . Renal disorder   . TIA (transient ischemic attack)     Patient Active Problem List   Diagnosis Date Noted  . Ureteral stone 02/04/2020  . Urolithiasis 02/04/2020  . OSA on CPAP  11/04/2015  . Hyperlipidemia LDL goal <70 03/18/2015  . Essential hypertension   . Acute ST elevation myocardial infarction (STEMI) (HCC) 02/20/2015  . CAD (coronary artery disease) 02/20/2015  . Diabetes mellitus (HCC) 02/20/2015  . ST elevation myocardial infarction (STEMI) involving other coronary artery of anterior wall (HCC) 02/20/2015  . STEMI (ST elevation myocardial infarction) (HCC) 02/20/2015    Past Surgical History:  Procedure Laterality Date  . CARDIAC CATHETERIZATION N/A 02/20/2015   Procedure: Left Heart Cath and Coronary Angiography;  Surgeon: Lennette Bihari, MD;  Location: [facility] INVASIVE CV LAB;  Service: Cardiovascular;  Laterality: N/A;  . CARDIAC CATHETERIZATION N/A 02/20/2015   Procedure: Coronary Stent Intervention;  Surgeon: Lennette Bihari, MD;  Location: [facility] INVASIVE CV LAB;  Service: Cardiovascular;  Laterality: N/A;  . CORONARY ARTERY BYPASS GRAFT         Family History  Problem Relation Age of Onset  . Congestive Heart Failure Mother   . Heart failure Mother   . Hypertension Mother   . Hypertension Sister   . Heart attack Neg Hx   . Stroke Neg Hx     Social History   Tobacco Use  . Smoking status: Never Smoker  Substance Use Topics  . Alcohol use: Yes    Alcohol/week: 0.0 standard drinks    Comment: very rare beer  . Drug use: No    Home Medications Prior to Admission medications   Medication Sig Start Date End Date Taking? Authorizing Provider  aspirin 81 MG tablet Take 81 mg by mouth daily.  Yes [provider]  clopidogrel (PLAVIX) 75 MG tablet Take 75 mg by mouth daily.   Yes [provider]  Coenzyme Q10 (COQ10 PO) Take 1 capsule by mouth daily.   Yes [provider]  Cyanocobalamin (B-12 PO) Take 1 tablet by mouth daily.   Yes [provider]  glipiZIDE (GLUCOTROL) 5 MG tablet Take 2.5 mg by mouth 2 (two) times daily before a meal.    Yes [provider]  glucosamine-chondroitin 500-400 MG  tablet Take 1 tablet by mouth daily.   Yes [provider]  losartan (COZAAR) 100 MG tablet Take 100 mg by mouth daily.   Yes [provider]  metoprolol (LOPRESSOR) 50 MG tablet Take 25 mg by mouth 2 (two) times daily.   Yes [provider]  Multiple Vitamins-Minerals (MULTIVITAMIN GUMMIES ADULT PO) Take 1 Dose by mouth daily.   Yes [provider]  nitroGLYCERIN (NITROSTAT) 0.4 MG SL tablet Place 1 tablet (0.4 mg total) under the tongue every 5 (five) minutes as needed for chest pain. 02/22/15  Yes Creig Hines, NP  rosuvastatin (CRESTOR) 10 MG tablet Take 5 mg by mouth daily.   Yes [provider]  vitamin C (ASCORBIC ACID) 500 MG tablet Take 500 mg by mouth daily.   Yes [provider]  losartan (COZAAR) 25 MG tablet Take 1 tablet (25 mg total) by mouth 2 (two) times daily. Patient not taking: Reported on 02/04/2020 03/16/15   Lennette Bihari, MD  ticagrelor (BRILINTA) 90 MG TABS tablet Take 1 tablet (90 mg total) by mouth 2 (two) times daily. Patient not taking: Reported on 02/04/2020 02/22/15   Creig Hines, NP    Allergies    Metformin and related  Review of Systems   Review of Systems Ten systems are reviewed and are negative for acute change except as noted in the HPI  Physical Exam Updated Vital Signs BP (!) 167/101   Pulse 60   Temp 97.7 F (36.5 C) (Oral)   Resp 16   Ht 5\' 6"  (1.676 m)   Wt 81.6 kg   SpO2 97%   BMI 29.05 kg/m   Physical Exam Constitutional:      General: He is not in acute distress.    Appearance: Normal appearance. He is well-developed. He is not ill-appearing or diaphoretic.  HENT:     Head: Normocephalic and atraumatic.  Eyes:     General: Vision grossly intact. Gaze aligned appropriately.     Pupils: Pupils are equal, round, and reactive to light.  Neck:     Trachea: Trachea and phonation normal.  Cardiovascular:     Rate and Rhythm: Normal rate and regular rhythm.      Pulses: Normal pulses.          Dorsalis pedis pulses are 2+ on the right side and 2+ on the left side.  Pulmonary:     Effort: Pulmonary effort is normal. No respiratory distress.  Abdominal:     General: There is no distension.     Palpations: Abdomen is soft. There is no pulsatile mass.     Tenderness: There is abdominal tenderness in the right lower quadrant. There is left CVA tenderness. There is no right CVA tenderness, guarding or rebound.  Genitourinary:    Comments: GU examination deferred by patient Musculoskeletal:        General: Normal range of motion.     Cervical back: Normal range of motion.  Feet:  Right foot:     Protective Sensation: 3 sites tested. 3 sites sensed.     Left foot:     Protective Sensation: 3 sites tested. 3 sites sensed.  Skin:    General: Skin is warm and dry.  Neurological:     Mental Status: He is alert.     GCS: GCS eye subscore is 4. GCS verbal subscore is 5. GCS motor subscore is 6.     Comments: Speech is clear and goal oriented, follows commands Major Cranial nerves without deficit, no facial droop Moves extremities without ataxia, coordination intact  Psychiatric:        Behavior: Behavior normal.     ED Results / Procedures / Treatments   Labs (all labs ordered are listed, but only abnormal results are displayed) Labs Reviewed  URINALYSIS, ROUTINE W REFLEX MICROSCOPIC - Abnormal; Notable for the following components:      Result Value   Ketones, ur 5 (*)    Protein, ur 30 (*)    All other components within normal limits  BASIC METABOLIC PANEL - Abnormal; Notable for the following components:   Glucose, Bld 155 (*)    BUN 26 (*)    Creatinine, Ser 1.93 (*)    GFR calc non Af Amer 34 (*)    GFR calc Af Amer 39 (*)    All other components within normal limits  SARS CORONAVIRUS 2 BY RT PCR (HOSPITAL ORDER, PERFORMED IN [facility])  URINE CULTURE  CBC    EKG None  Radiology CT Renal Stone Study  Result  Date: 02/04/2020 CLINICAL DATA:  Right flank pain EXAM: CT ABDOMEN AND PELVIS WITHOUT CONTRAST TECHNIQUE: Multidetector CT imaging of the abdomen and pelvis was performed following the standard protocol without oral or IV contrast. COMPARISON:  None. FINDINGS: Lower chest: Lung bases are clear. There are multiple foci of coronary artery calcification. Hepatobiliary: No focal liver lesions are appreciable on this noncontrast enhanced study. Gallbladder wall is not appreciably thickened. There is no biliary duct dilatation. Pancreas: There is no pancreatic mass or inflammatory focus. Spleen: No splenic lesions are evident. Adrenals/Urinary Tract: Adrenals bilaterally appear normal. There is a cyst arising from the inferior aspect of the right kidney measuring 2.7 x 2.3 cm. There is an apparent 4 mm hyperdense cyst in the mid left kidney with suspected tiny hyperdense cysts elsewhere. There is severe hydronephrosis on the right. The right kidney is edematous. There is a calculus in the posterior aspect of the right renal pelvis which measures 2.4 x 2.0 cm. There is a calculus in the posterior mid right kidney measuring 1.0 x 0.9 cm. There is a calculus in the posterior mid right kidney measuring 1.5 x 1.1 cm. Scattered subcentimeter calculi are noted throughout the right kidney elsewhere. On the left, there are scattered calculi ranging in size from as small as 1 mm to as large as 5 x 5 mm. No hydronephrosis is noted on the left. There is ureterectasis on the right. There is a calculus at the lumbosacral level in the right ureter measuring 2.9 x 1.6 cm. Slightly proximal to this larger calculus, there is a calculus measuring 0.8 x 0.6 cm. The urinary bladder is midline with wall thickness within normal limits. Stomach/Bowel: There is no appreciable bowel wall or mesenteric thickening. There are scattered sigmoid diverticula without diverticulitis. There is no appreciable bowel obstruction. The terminal ileum appears  unremarkable. There is no appreciable free air or portal venous air. Vascular/Lymphatic: No  abdominal aortic aneurysm. There is aortic and iliac artery atherosclerosis. There is moderate calcification in both proximal renal arteries as well as in the proximal superior mesenteric artery. No adenopathy is evident in the abdomen or pelvis. Reproductive: Prostate and seminal vesicles are normal in size and contour. There are scattered small prostatic calculi. No evident pelvic mass. Other: The appendix appears normal. No evident abscess or ascites in the abdomen or pelvis. There is fat in the left inguinal ring. Musculoskeletal: There is degenerative change in the lumbar spine. No blastic or lytic lesions are evident. No intramuscular lesions are appreciable IMPRESSION: 1. Severe hydronephrosis on the right. There is an irregular calculus at the lumbosacral junction on the right measuring 2.9 x 1.6 cm. There is a 0.8 x 0.6 cm calculus slightly proximal to this larger calculus on the right. There are calculi within the right renal pelvis, largest measuring 0.4 x 2.0 cm. Multiple smaller calculi noted in each kidney. 2.  No hydronephrosis on the left. 3. No bowel obstruction. Occasional sigmoid diverticula without diverticulitis. No abscess in the abdomen or pelvis. Appendix appears normal. 4. Aortic Atherosclerosis (ICD10-I70.0). Multiple foci of mesenteric arterial vessel calcification as well as foci of coronary artery and iliac artery atherosclerosis. Electronically Signed   By: Bretta Bang III M.D.   On: 02/04/2020 09:45    Procedures Procedures (including critical care time)  Medications Ordered in ED Medications  HYDROmorphone (DILAUDID) injection 0.5 mg (has no administration in time range)  0.45 % sodium chloride infusion (has no administration in time range)  acetaminophen (OFIRMEV) IV 1,000 mg (has no administration in time range)  ketorolac (TORADOL) 15 MG/ML injection 15 mg (has no  administration in time range)  acetaminophen (TYLENOL) tablet 500 mg (500 mg Oral Given 02/04/20 0908)  sodium chloride 0.9 % bolus 1,000 mL (1,000 mLs Intravenous New Bag/Given 02/04/20 1010)    ED Course  I have reviewed the triage vital signs and the nursing notes.  Pertinent labs & imaging results that were available during my care of the patient were reviewed by me and considered in my medical decision making (see chart for details).  Clinical Course as of Feb 04 1112  Tue Feb 04, 2020  1027 Dr. Marlou Porch   [BM]    Clinical Course User Index [BM] Elizabeth Palau   MDM Rules/Calculators/A&P                          Additional History Obtained: 1. Nursing notes from this visit. 2. Reviewed EMR, no pertinent recent visits. ------------- 74 year old male arrives resting comfortably no acute distress fully alert and oriented.  He reports ongoing right flank pain and right lower abdominal pain for the past 5-6 days feels similar to previous kidney stone.  No hematuria or dysuria, no fever chills or vomiting.  He has no injuries.  Initial concern is for possible kidney stone.  He is additionally hypertensive here in the ER however he is equal distal pulses, no chest pain or shortness of breath and no pulsatile masses suggestive of AAA.  Low suspicion for AAA, dissection at this time.  Will obtain basic blood work and CT renal stone study.  Suspect patient's hypertension may be secondary to his pain however he refuses narcotic medications requesting only Tylenol at this time as he plans on driving home. - I ordered, reviewed and interpreted labs which include: CBC within normal limits, no leukocytosis suggest infection and no evidence of  anemia. BMP shows AKI with creatinine 1.93 and BUN 26, baseline appears to be a creatinine of around 1.  No emergent electrolyte derangement or gap. Urinalysis shows ketones and protein but no evidence of infection.  Urine culture was ordered. Covid  test is pending.   CT Renal Stone Study:  IMPRESSION:  1. Severe hydronephrosis on the right. There is an irregular  calculus at the lumbosacral junction on the right measuring 2.9 x  1.6 cm. There is a 0.8 x 0.6 cm calculus slightly proximal to this  larger calculus on the right. There are calculi within the right  renal pelvis, largest measuring 0.4 x 2.0 cm. Multiple smaller  calculi noted in each kidney.    2. No hydronephrosis on the left.    3. No bowel obstruction. Occasional sigmoid diverticula without  diverticulitis. No abscess in the abdomen or pelvis. Appendix  appears normal.    4. Aortic Atherosclerosis (ICD10-I70.0). Multiple foci of mesenteric  arterial vessel calcification as well as foci of coronary artery and  iliac artery atherosclerosis.  - I discussed case with urologist Dr. Marlou PorchHerrick at 10:27 AM, he has asked that I place temporary admission orders for this patient, [facility] Marl[facility]Her[facility] stent later on today. - Patient updated on findings he is agreeable to plan of care, now he is also agreeable for stronger pain medicine given that he will be staying today.  On reevaluation he is resting comfortably no acute distress.  Dilaudid ordered for pain control.  I then placed temporary admission orders for Dr. Marlou PorchHerrick, patient is n.p.o.  Patient was seen and evaluated by Dr. Rubin PayorPickering during this visit who agrees with care plan.  Note: Portions of this report may have been transcribed using voice recognition software. Every effort was made to ensure accuracy; however, inadvertent computerized transcription errors may still be present. Final Clinical Impression(s) / ED Diagnoses Final diagnoses:  Right ureteral stone  AKI (acute kidney injury) [facility](HCC)    Rx / DC Orders ED Discharge Orders    None       Elizabeth PalauMorelli, Sasha Rogel A, PA-C 02/04/20 1114    Benjiman CorePickering, Nathan, MD 02/04/20 [phone]

## 2020-02-04 NOTE — Discharge Summary (Signed)
Date of admission: 02/04/2020  Date of discharge: 02/04/2020  Admission diagnosis: right obstructing ureteral stone  Discharge diagnosis: same  Secondary diagnoses:  Patient Active Problem List   Diagnosis Date Noted  . Ureteral stone 02/04/2020  . Urolithiasis 02/04/2020  . OSA on CPAP 11/04/2015  . Hyperlipidemia LDL goal <70 03/18/2015  . Essential hypertension   . Acute ST elevation myocardial infarction (STEMI) ([place]) 02/20/2015  . CAD (coronary artery disease) 02/20/2015  . Diabetes mellitus ([place]) 02/20/2015  . ST elevation myocardial infarction (STEMI) involving other coronary artery of anterior wall ([place]) 02/20/2015  . STEMI (ST elevation myocardial infarction) ([place]) 02/20/2015    Procedures performed: Procedure(s): CYSTOSCOPY WITH RETROGRADE PYELOGRAM/URETERAL STENT PLACEMENT  History and Physical: For full details, please see admission history and physical. Briefly, Paul Levine is a 74 y.o. year old patient with large right sided ureteral stone and additional stones in the right renal pelvis.   Hospital Course: Patient was admitted through the ED and then taken to the OR for stent placement.  His pain was controlled with IV pain medications.   His hospital course was uncomplicated.  In the PACU he had met discharge criteria: was eating a regular diet, was up and ambulating independently,  pain was well controlled, was voiding without a catheter, and was ready to for discharge.  He felt significantly better then prior to admission.  PE: NAD Vitals:   02/04/20 1545 02/04/20 1600 02/04/20 1726 02/04/20 1740  BP: 136/80 (!) 152/86 (!) 154/84 (!) 149/85  Pulse: (!) 56 61 76 72  Resp:   14 15  Temp:   (!) 97.4 F (36.3 C) (!) 97.4 F (36.3 C)  TempSrc:      SpO2: 95% 98% 97% 96%  Weight:      Height:      Alert and oriented Non-labored breathing Abdomen soft Extremities soft  Laboratory values:  Recent Labs    02/04/20 0820  WBC 9.0  HGB 14.8  HCT 44.3    Recent Labs    02/04/20 0820  NA 137  K 4.4  CL 102  CO2 23  GLUCOSE 155*  BUN 26*  CREATININE 1.93*  CALCIUM 9.1   No results for input(s): LABPT, INR in the last 72 hours. No results for input(s): LABURIN in the last 72 hours. Results for orders placed or performed during the hospital encounter of 02/04/20  SARS Coronavirus 2 by RT PCR (hospital order, performed in [facility] hospital lab) Nasopharyngeal Nasopharyngeal Swab     Status: None   Collection Time: 02/04/20 10:11 AM   Specimen: Nasopharyngeal Swab  Result Value Ref Range Status   SARS Coronavirus 2 NEGATIVE NEGATIVE Final    Comment: (NOTE) SARS-CoV-2 target nucleic acids are NOT DETECTED.  The SARS-CoV-2 RNA is generally detectable in upper and lower respiratory specimens during the acute phase of infection. The lowest concentration of SARS-CoV-2 viral copies this assay can detect is 250 copies / mL. A negative result does not preclude SARS-CoV-2 infection and should not be used as the sole basis for treatment or other patient management decisions.  A negative result may occur with improper specimen collection / handling, submission of specimen other than nasopharyngeal swab, presence of viral mutation(s) within the areas targeted by this assay, and inadequate number of viral copies (<250 copies / mL). A negative result must be combined with clinical observations, patient history, and epidemiological information.  Fact Sheet for Patients:   StrictlyIdeas.no  Fact Sheet for Healthcare Providers: BankingDealers.co.za  This test is not yet approved or  cleared by the [place] and has been authorized for detection and/or diagnosis of SARS-CoV-2 by FDA under an Emergency Use Authorization (EUA).  This EUA will remain in effect (meaning this test can be used) for the duration of the COVID-19 declaration under Section 564(b)(1) of the Act, 21  U.S.C. section 360bbb-3(b)(1), unless the authorization is terminated or revoked sooner.  Performed at [facility], [place] [address], [place], [place] [address]     Disposition: Home  Discharge instruction: The patient was instructed to be ambulatory but told to refrain from heavy lifting, strenuous activity, or driving.   Discharge medications:  Allergies as of 02/04/2020      Reactions   Metformin And Related Other (See Comments)   GI upset      Medication List    TAKE these medications   aspirin 81 MG tablet Take 81 mg by mouth daily.   B-12 PO Take 1 tablet by mouth daily.   clopidogrel 75 MG tablet Commonly known as: PLAVIX Take 75 mg by mouth daily.   COQ10 PO Take 1 capsule by mouth daily.   glipiZIDE 5 MG tablet Commonly known as: GLUCOTROL Take 2.5 mg by mouth 2 (two) times daily before a meal.   glucosamine-chondroitin 500-400 MG tablet Take 1 tablet by mouth daily.   losartan 100 MG tablet Commonly known as: COZAAR Take 100 mg by mouth daily.   losartan 25 MG tablet Commonly known as: COZAAR Take 1 tablet (25 mg total) by mouth 2 (two) times daily.   metoprolol tartrate 50 MG tablet Commonly known as: LOPRESSOR Take 25 mg by mouth 2 (two) times daily.   MULTIVITAMIN GUMMIES ADULT PO Take 1 Dose by mouth daily.   nitroGLYCERIN 0.4 MG SL tablet Commonly known as: Nitrostat Place 1 tablet (0.4 mg total) under the tongue every 5 (five) minutes as needed for chest pain.   rosuvastatin 10 MG tablet Commonly known as: CRESTOR Take 5 mg by mouth daily.   ticagrelor 90 MG Tabs tablet Commonly known as: BRILINTA Take 1 tablet (90 mg total) by mouth 2 (two) times daily.   traMADol 50 MG tablet Commonly known as: Ultram Take 1-2 tablets (50-100 mg total) by mouth every 6 (six) hours as needed for moderate pain.   vitamin C 500 MG tablet Commonly known as: ASCORBIC ACID Take 500 mg by mouth daily.       Followup:   Follow-up  Information    Ardis Hughs, MD. Schedule an appointment as soon as possible for a visit in 2 weeks.   Specialty: Urology Contact information: [place]  [id number] [phone]

## 2020-02-04 NOTE — Anesthesia Procedure Notes (Signed)
Procedure Name: LMA Insertion Date/Time: 02/04/2020 4:52 PM Performed by: Macie Burows, CRNA Pre-anesthesia Checklist: Patient identified, Emergency Drugs available, Suction available and Patient being monitored Patient Re-evaluated:Patient Re-evaluated prior to induction Oxygen Delivery Method: Circle system utilized Preoxygenation: Pre-oxygenation with 100% oxygen Induction Type: IV induction Number of attempts: 1 Placement Confirmation: positive ETCO2 and breath sounds checked- equal and bilateral Tube secured with: Tape Dental Injury: Teeth and Oropharynx as per pre-operative assessment

## 2020-02-04 NOTE — ED Triage Notes (Signed)
Pt reports hx of renal stones, stated he started having R flank pain a few days ago while out of town, now has pressure to R flank, denies any dysuria or hematuria. Reports he has been drinking extra fluids thinking he could flush it out but has had no relief.

## 2020-02-04 NOTE — Anesthesia Preprocedure Evaluation (Addendum)
Anesthesia Evaluation  Patient identified by MRN, date of birth, ID band Patient awake    Reviewed: Allergy & Precautions, NPO status , Patient's Chart, lab work & pertinent test results  Airway Mallampati: III  TM Distance: >3 FB Neck ROM: Full    Dental no notable dental hx.    Pulmonary sleep apnea and Continuous Positive Airway Pressure Ventilation ,    Pulmonary exam normal        Cardiovascular hypertension, Pt. on medications and Pt. on home beta blockers + CAD, + Past MI, + Cardiac Stents (2016), + CABG (x3 2003) and +CHF  Normal cardiovascular exam  TTE 2019 EF 50-55%, G2DD, valves ok  LHC 2016 The left ventricular systolic function is normal. Mid LAD lesion, 100% stenosed. Dist RCA lesion, 100% stenosed. Mid RCA-1 lesion, 95% stenosed. Mid RCA-2 lesion, 80% stenosed. LIMA was injected is normal in caliber, and is anatomically normal. Radial artery . Prox Graft lesion, between 3rd Mrg and 1st RPLB, 50% stenosed. Dist LAD lesion, 90% stenosed. There is a 0% residual stenosis post intervention. A drug-eluting stent was placed. Ost Cx lesion, 100% stenosed. There is a 0% residual stenosis post intervention. A drug-eluting stent was placed. LM lesion, 95% stenosed. There is a 0% residual stenosis post intervention. A drug-eluting stent was placed. Ost LAD to Prox LAD lesion, 95% stenosed. There is a 5% residual stenosis post intervention    Neuro/Psych TIA   GI/Hepatic negative GI ROS, Neg liver ROS,   Endo/Other  diabetes, Oral Hypoglycemic Agents  Renal/GU Renal InsufficiencyRenal disease     Musculoskeletal negative musculoskeletal ROS (+)   Abdominal Normal abdominal exam  (+) - obese,   Peds  Hematology  (+) Blood dyscrasia (on plavix), , HLD   Anesthesia Other Findings Right ureteral stone  Reproductive/Obstetrics                           Anesthesia  Physical Anesthesia Plan  ASA: III  Anesthesia Plan: General   Post-op Pain Management:    Induction: Intravenous  PONV Risk Score and Plan: 2 and Ondansetron and Dexamethasone  Airway Management Planned: LMA  Additional Equipment: None  Intra-op Plan:   Post-operative Plan: Extubation in OR  Informed Consent: I have reviewed the patients History and Physical, chart, labs and discussed the procedure including the risks, benefits and alternatives for the proposed anesthesia with the patient or authorized representative who has indicated his/her understanding and acceptance.     Dental advisory given  Plan Discussed with: CRNA  Anesthesia Plan Comments:        Anesthesia Quick Evaluation

## 2020-02-04 NOTE — ED Notes (Signed)
Patient transported to CT 

## 2020-02-04 NOTE — H&P (Signed)
I have been asked to see the patient by Dr. Benjiman Core, for evaluation and management of right obstructing ureteral stone.  History of present illness: 74 year old white male with a history of nephrolithiasis he presented with ongoing right-sided flank pain and discomfort.  He was not having any associated nausea or vomiting, but was having persistent pain that was preventing him from sleeping.  The patient has a history of kidney stones, and he has since that this may be what it was.  As such he presented to the emergency department.  He was not have any gross hematuria or dysuria.  The patient in the emergency department underwent a CAT scan demonstrating a 2 cm right mid ureteral stone with proximal hydroureteronephrosis with additional stones within the kidney as well as the ureter.  His urine analysis was fairly reassuring without evidence of infection.  His white blood cell count was normal.  His renal function was slightly worse than his baseline.  In the ER we were able to get his pain under control with IV pain medication.  The patient has a history of coronary artery disease and had a stent placed in 2016.  Since that time he has had no ongoing chest pain or issues.  Review of systems: A 12 point comprehensive review of systems was obtained and is negative unless otherwise stated in the history of present illness.  Patient Active Problem List   Diagnosis Date Noted  . Ureteral stone 02/04/2020  . Urolithiasis 02/04/2020  . OSA on CPAP 11/04/2015  . Hyperlipidemia LDL goal <70 03/18/2015  . Essential hypertension   . Acute ST elevation myocardial infarction (STEMI) (HCC) 02/20/2015  . CAD (coronary artery disease) 02/20/2015  . Diabetes mellitus (HCC) 02/20/2015  . ST elevation myocardial infarction (STEMI) involving other coronary artery of anterior wall (HCC) 02/20/2015  . STEMI (ST elevation myocardial infarction) (HCC) 02/20/2015    No current facility-administered  medications on file prior to encounter.   Current Outpatient Medications on File Prior to Encounter  Medication Sig Dispense Refill  . aspirin 81 MG tablet Take 81 mg by mouth daily.    . clopidogrel (PLAVIX) 75 MG tablet Take 75 mg by mouth daily.    . Coenzyme Q10 (COQ10 PO) Take 1 capsule by mouth daily.    . Cyanocobalamin (B-12 PO) Take 1 tablet by mouth daily.    [name] glipiZIDE (GLUCOTROL) 5 MG tablet Take 2.5 mg by mouth 2 (two) times daily before a meal.     . glucosamine-chondroitin 500-400 MG tablet Take 1 tablet by mouth daily.    [name] losartan (COZAAR) 100 MG tablet Take 100 mg by mouth daily.    . metoprolol (LOPRESSOR) 50 MG tablet Take 25 mg by mouth 2 (two) times daily.    . Multiple Vitamins-Minerals (MULTIVITAMIN GUMMIES ADULT PO) Take 1 Dose by mouth daily.    . nitroGLYCERIN (NITROSTAT) 0.4 MG SL tablet Place 1 tablet (0.4 mg total) under the tongue every 5 (five) minutes as needed for chest pain. 25 tablet 3  . rosuvastatin (CRESTOR) 10 MG tablet Take 5 mg by mouth daily.    . vitamin C (ASCORBIC ACID) 500 MG tablet Take 500 mg by mouth daily.    [name] losartan (COZAAR) 25 MG tablet Take 1 tablet (25 mg total) by mouth 2 (two) times daily. (Patient not taking: Reported on 02/04/2020) 60 tablet 6  . ticagrelor (BRILINTA) 90 MG TABS tablet Take 1 tablet (90 mg total) by mouth 2 (two) times daily. (  Patient not taking: Reported on 02/04/2020) 60 tablet 6    Past Medical History:  Diagnosis Date  . CAD (coronary artery disease)    a. s/p CABG x3 (LIMA->LAD, Y radial graft off of LIMA to OM3 and RPL1) ~2003;  b. 01/2015 STEMI (aVR/aVL)/PCI: LM 95/LCX 100p (3.5x15 Resolute DES covering LM/LCX), LAD 95ost/p (PTCA), 122m, 90d after LIMA insertion (2.25x32 Synergy DES), RCA 95/61m, 100d, LIMA->LAD patent, free radial comes off of LIMA and plugs into OM3 (ok) and then RPL1 (ok). 50% stenosis in graft between touchdowns. Nl EF.  . Diabetes mellitus without complication (HCC)   . Essential  hypertension   . Hyperlipidemia   . Ischemic cardiomyopathy    a.  Echo 02/22/15:   EF 35-40%, mod diff HK, WMA cannot be excluded, mod LAE, mild RVE, mild to mod RAE  . Renal disorder   . TIA (transient ischemic attack)     Past Surgical History:  Procedure Laterality Date  . CARDIAC CATHETERIZATION N/A 02/20/2015   Procedure: Left Heart Cath and Coronary Angiography;  Surgeon: Lennette Bihari, MD;  Location: [facility] INVASIVE CV LAB;  Service: Cardiovascular;  Laterality: N/A;  . CARDIAC CATHETERIZATION N/A 02/20/2015   Procedure: Coronary Stent Intervention;  Surgeon: Lennette Bihari, MD;  Location: [facility] INVASIVE CV LAB;  Service: Cardiovascular;  Laterality: N/A;  . CORONARY ARTERY BYPASS GRAFT      Social History   Tobacco Use  . Smoking status: Never Smoker  Substance Use Topics  . Alcohol use: Yes    Alcohol/week: 0.0 standard drinks    Comment: very rare beer  . Drug use: No    Family History  Problem Relation Age of Onset  . Congestive Heart Failure Mother   . Heart failure Mother   . Hypertension Mother   . Hypertension Sister   . Heart attack Neg Hx   . Stroke Neg Hx     PE: Vitals:   02/04/20 0813 02/04/20 0908 02/04/20 0915 02/04/20 1108  BP: (!) 173/90 (!) 184/106 (!) 181/101 (!) 167/101  Pulse: 72 63 63 60  Resp: 20 18  16   Temp: 97.7 F (36.5 C)     TempSrc: Oral     SpO2: 98% 98% 97% 97%  Weight:    81.6 kg  Height:    5\' 6"  (1.676 m)   Patient appears to be in no acute distress  patient is alert and oriented x3 Atraumatic normocephalic head No cervical or supraclavicular lymphadenopathy appreciated No increased work of breathing, no audible wheezes/rhonchi Regular sinus rhythm/rate Abdomen is soft, nontender, nondistended, right-sided CVA tenderness Lower extremities are symmetric without appreciable edema Grossly neurologically intact No identifiable skin lesions  Recent Labs    02/04/20 0820  WBC 9.0  HGB 14.8  HCT 44.3   Recent Labs     02/04/20 0820  NA 137  K 4.4  CL 102  CO2 23  GLUCOSE 155*  BUN 26*  CREATININE 1.93*  CALCIUM 9.1   No results for input(s): LABPT, INR in the last 72 hours. No results for input(s): LABURIN in the last 72 hours. Results for orders placed or performed during the hospital encounter of 02/04/20  SARS Coronavirus 2 by RT PCR (hospital order, performed in [facility] hospital lab) Nasopharyngeal Nasopharyngeal Swab     Status: None   Collection Time: 02/04/20 10:11 AM   Specimen: Nasopharyngeal Swab  Result Value Ref Range Status   SARS Coronavirus 2 NEGATIVE NEGATIVE Final    Comment: (NOTE)  SARS-CoV-2 target nucleic acids are NOT DETECTED.  The SARS-CoV-2 RNA is generally detectable in upper and lower respiratory specimens during the acute phase of infection. The lowest concentration of SARS-CoV-2 viral copies this assay can detect is 250 copies / mL. A negative result does not preclude SARS-CoV-2 infection and should not be used as the sole basis for treatment or other patient management decisions.  A negative result may occur with improper specimen collection / handling, submission of specimen other than nasopharyngeal swab, presence of viral mutation(s) within the areas targeted by this assay, and inadequate number of viral copies (<250 copies / mL). A negative result must be combined with clinical observations, patient history, and epidemiological information.  Fact Sheet for Patients:   BoilerBrush.com.cy  Fact Sheet for Healthcare Providers: https://pope.com/  This test is not yet approved or  cleared by the [place] FDA and has been authorized for detection and/or diagnosis of SARS-CoV-2 by FDA under an Emergency Use Authorization (EUA).  This EUA will remain in effect (meaning this test can be used) for the duration of the COVID-19 declaration under Section 564(b)(1) of the Act, 21 U.S.C. section 360bbb-3(b)(1),  unless the authorization is terminated or revoked sooner.  Performed at [facility], [address] [place], [place] [address]     Imaging: I reviewed the patient's CT scan which demonstrates a large stone in the right mid ureter with some nonobstructing stones more proximal in the ureter as well as some stones in the kidney.  He has severe hydronephrosis.  Imp/ Recommendations: The patient has a very large stone in the right mid ureter which is impassable.  Fortunately his pain is reasonably well controlled at this time.  However, he is not going to pass a stone on his own, and as such I recommended that we place a stent so the patient can be discharged home.  Following the stent and recovery we can discuss additional treatment options which will likely include a discussion of percutaneous nephrolithotomy.      Crist Fat

## 2020-02-05 ENCOUNTER — Encounter (HOSPITAL_COMMUNITY): Payer: Self-pay | Admitting: Urology

## 2020-02-05 LAB — URINE CULTURE: Culture: NO GROWTH

## 2020-02-05 NOTE — Anesthesia Postprocedure Evaluation (Signed)
Anesthesia Post Note  Patient: Paul Levine  Procedure(s) Performed: CYSTOSCOPY WITH RETROGRADE PYELOGRAM/URETERAL STENT PLACEMENT (Right )     Patient location during evaluation: PACU Anesthesia Type: General Level of consciousness: awake and alert Pain management: pain level controlled Vital Signs Assessment: post-procedure vital signs reviewed and stable Respiratory status: spontaneous breathing, nonlabored ventilation and respiratory function stable Cardiovascular status: blood pressure returned to baseline and stable Postop Assessment: no apparent nausea or vomiting Anesthetic complications: no   No complications documented.  Last Vitals:  Vitals:   02/04/20 1726 02/04/20 1740  BP: (!) 154/84 (!) 149/85  Pulse: 76 72  Resp: 14 15  Temp: (!) 36.3 C (!) 36.3 C  SpO2: 97% 96%    Last Pain:  Vitals:   02/04/20 1740  TempSrc:   PainSc: 0-No pain                 Beryle Lathe

## 2020-02-19 ENCOUNTER — Other Ambulatory Visit: Payer: Self-pay | Admitting: Urology

## 2020-03-05 NOTE — Patient Instructions (Signed)
DUE TO COVID-19 ONLY ONE VISITOR IS ALLOWED TO COME WITH YOU AND STAY IN THE WAITING ROOM ONLY DURING PRE OP AND PROCEDURE DAY OF SURGERY. THE 2 VISITORS MAY VISIT WITH YOU AFTER SURGERY IN YOUR PRIVATE ROOM DURING VISITING HOURS ONLY!  YOU NEED TO HAVE A COVID 19 TEST ON_8-9-21______ @___11 :00____, THIS TEST MUST BE DONE BEFORE SURGERY,  COVID TESTING SITE [address] [place] , IT IS ON THE RIGHT GOING OUT [address] APPROXIMATELY  2 MINUTES PAST [employer] ON THE RIGHT. ONCE YOUR COVID TEST IS COMPLETED,  PLEASE BEGIN THE QUARANTINE INSTRUCTIONS AS OUTLINED IN YOUR HANDOUT.                Karry Causer  03/05/2020   Your procedure is scheduled on: 03-12-20   Report to [facility] Main  Entrance   Report to admitting at      0530  AM     Call this number if you have problems the morning of surgery [phone]    Remember: Do not eat food or drink liquids :After Midnight.   BRUSH YOUR TEETH MORNING OF SURGERY AND RINSE YOUR MOUTH OUT, NO CHEWING GUM CANDY OR MINTS.     Take these medicines the morning of surgery with A SIP OF WATER: metoprolol, crestor  ONLY TAKE MORNING/LUNCH DOSE OF GLIPIZIDE THE DAY BEFORE SURGERY NO EVENING DOSE  DO NOT TAKE ANY DIABETIC MEDICATIONS DAY OF YOUR SURGERY                               You may not have any metal on your body including hair pins and              piercings  Do not wear jewelry,  lotions, powders or perfumes, deodorant              Men may shave face and neck.   Do not bring valuables to the hospital. [facility] IS NOT             RESPONSIBLE   FOR VALUABLES.  Contacts, dentures or bridgework may not be worn into surgery.      Patients discharged the day of surgery will not be allowed to drive home. IF YOU ARE HAVING SURGERY AND GOING HOME THE SAME DAY, YOU MUST HAVE AN ADULT TO DRIVE YOU HOME AND BE WITH YOU FOR 24 HOURS. YOU MAY GO HOME BY TAXI OR UBER OR ORTHERWISE, BUT AN ADULT  MUST ACCOMPANY YOU HOME AND STAY WITH YOU FOR 24 HOURS.  Name and phone number of your driver:  Special Instructions: N/A              Please read over the following fact sheets you were given: _____________________________________________________________________ [facility] - Preparing for Surgery Before surgery, you can play an important role.  Because skin is not sterile, your skin needs to be as free of germs as possible.  You can reduce the number of germs on your skin by washing with CHG (chlorahexidine gluconate) soap before surgery.  CHG is an antiseptic cleaner which kills germs and bonds with the skin to continue killing germs even after washing. Please DO NOT use if you have an allergy to CHG or antibacterial soaps.  If your skin becomes reddened/irritated stop using the CHG and inform your nurse when you arrive at Short Stay. Do not shave (including legs and underarms) for  at least 48 hours prior to the first CHG shower.  You may shave your face/neck. Please follow these instructions carefully:  1.  Shower with CHG Soap the night before surgery and the  morning of Surgery.  2.  If you choose to wash your hair, wash your hair first as usual with your  normal  shampoo.  3.  After you shampoo, rinse your hair and body thoroughly to remove the  shampoo.                           4.  Use CHG as you would any other liquid soap.  You can apply chg directly  to the skin and wash                       Gently with a scrungie or clean washcloth.  5.  Apply the CHG Soap to your body ONLY FROM THE NECK DOWN.   Do not use on face/ open                           Wound or open sores. Avoid contact with eyes, ears mouth and genitals (private parts).                       Wash face,  Genitals (private parts) with your normal soap.             6.  Wash thoroughly, paying special attention to the area where your surgery  will be performed.  7.  Thoroughly rinse your body with warm water from the neck  down.  8.  DO NOT shower/wash with your normal soap after using and rinsing off  the CHG Soap.                9.  Pat yourself dry with a clean towel.            10.  Wear clean pajamas.            11.  Place clean sheets on your bed the night of your first shower and do not  sleep with pets. Day of Surgery : Do not apply any lotions/deodorants the morning of surgery.  Please wear clean clothes to the hospital/surgery center.  FAILURE TO FOLLOW THESE INSTRUCTIONS MAY RESULT IN THE CANCELLATION OF YOUR SURGERY PATIENT SIGNATURE_________________________________  NURSE SIGNATURE__________________________________  ________________________________________________________________________

## 2020-03-05 NOTE — Progress Notes (Addendum)
PCP -Dr. Romero [facility] Clearance on chart 02-20-20 Cardiologist - [place]  02-27-19 telephone visit on chart  PPM/ICD -  Device Orders -  Rep Notified -   Chest x-ray -  EKG - 11-21-19 on chart Stress Test - 2018 on chart ECHO -  Cardiac Cath -   Sleep Study -  CPAP -   Fasting Blood Sugar -  Checks Blood Sugar __0___ times a day  Blood Thinner Instructions: Plavix , ASA 81mg  hold 5 days prior  Aspirin Instructions:  ERAS Protcol - PRE-SURGERY   COVID TEST- 8-9  Activity - Walks 7 miles a day   Anesthesia review:CABG 2016,TIA, CAD, HTN   stent, Agents orange , DM, OSA cpap, uRINE CULTURE  Patient denies shortness of breath, fever, cough and chest pain at PAT appointment   All instructions explained to the patient, with a verbal understanding of the material. Patient agrees to go over the instructions while at home for a better understanding. Patient also instructed to self quarantine after being tested for COVID-19. The opportunity to ask questions was provided.

## 2020-03-09 ENCOUNTER — Encounter (HOSPITAL_COMMUNITY): Payer: Self-pay

## 2020-03-09 ENCOUNTER — Encounter (HOSPITAL_COMMUNITY)
Admission: RE | Admit: 2020-03-09 | Discharge: 2020-03-09 | Disposition: A | Discharge status: Home / Self Care | Payer: Medicare Other | Source: Ambulatory Visit | Attending: Urology | Admitting: Urology

## 2020-03-09 ENCOUNTER — Other Ambulatory Visit (HOSPITAL_COMMUNITY)
Admission: RE | Admit: 2020-03-09 | Discharge: 2020-03-09 | Disposition: A | Discharge status: Home / Self Care | Payer: Medicare Other | Source: Ambulatory Visit | Attending: Urology | Admitting: Urology

## 2020-03-09 ENCOUNTER — Other Ambulatory Visit: Payer: Self-pay

## 2020-03-09 DIAGNOSIS — Z951 Presence of aortocoronary bypass graft: Secondary | ICD-10-CM | POA: Diagnosis not present

## 2020-03-09 DIAGNOSIS — Z8673 Personal history of transient ischemic attack (TIA), and cerebral infarction without residual deficits: Secondary | ICD-10-CM | POA: Insufficient documentation

## 2020-03-09 DIAGNOSIS — Z79899 Other long term (current) drug therapy: Secondary | ICD-10-CM | POA: Diagnosis not present

## 2020-03-09 DIAGNOSIS — E785 Hyperlipidemia, unspecified: Secondary | ICD-10-CM | POA: Diagnosis not present

## 2020-03-09 DIAGNOSIS — E119 Type 2 diabetes mellitus without complications: Secondary | ICD-10-CM | POA: Diagnosis not present

## 2020-03-09 DIAGNOSIS — Z20822 Contact with and (suspected) exposure to covid-19: Secondary | ICD-10-CM | POA: Diagnosis not present

## 2020-03-09 DIAGNOSIS — I2510 Atherosclerotic heart disease of native coronary artery without angina pectoris: Secondary | ICD-10-CM | POA: Insufficient documentation

## 2020-03-09 DIAGNOSIS — Z01818 Encounter for other preprocedural examination: Secondary | ICD-10-CM | POA: Insufficient documentation

## 2020-03-09 DIAGNOSIS — N200 Calculus of kidney: Secondary | ICD-10-CM | POA: Insufficient documentation

## 2020-03-09 DIAGNOSIS — Z7982 Long term (current) use of aspirin: Secondary | ICD-10-CM | POA: Insufficient documentation

## 2020-03-09 DIAGNOSIS — I110 Hypertensive heart disease with heart failure: Secondary | ICD-10-CM | POA: Insufficient documentation

## 2020-03-09 DIAGNOSIS — I509 Heart failure, unspecified: Secondary | ICD-10-CM | POA: Insufficient documentation

## 2020-03-09 DIAGNOSIS — Z7902 Long term (current) use of antithrombotics/antiplatelets: Secondary | ICD-10-CM | POA: Diagnosis not present

## 2020-03-09 DIAGNOSIS — Z7901 Long term (current) use of anticoagulants: Secondary | ICD-10-CM | POA: Insufficient documentation

## 2020-03-09 HISTORY — DX: Sleep apnea, unspecified: G47.30

## 2020-03-09 HISTORY — DX: Depression, unspecified: F32.A

## 2020-03-09 HISTORY — DX: Lesion of ulnar nerve, right upper limb: G56.21

## 2020-03-09 HISTORY — DX: Personal history of urinary calculi: Z87.442

## 2020-03-09 LAB — CBC
HCT: 41.4 % (ref 39.0–52.0)
Hemoglobin: 13.7 g/dL (ref 13.0–17.0)
MCH: 32.5 pg (ref 26.0–34.0)
MCHC: 33.1 g/dL (ref 30.0–36.0)
MCV: 98.1 fL (ref 80.0–100.0)
Platelets: 226 10*3/uL (ref 150–400)
RBC: 4.22 MIL/uL (ref 4.22–5.81)
RDW: 14 % (ref 11.5–15.5)
WBC: 5.1 10*3/uL (ref 4.0–10.5)
nRBC: 0 % (ref 0.0–0.2)

## 2020-03-09 LAB — GLUCOSE, CAPILLARY: Glucose-Capillary: 149 mg/dL — ABNORMAL HIGH (ref 70–99)

## 2020-03-09 LAB — BASIC METABOLIC PANEL
Anion gap: 10 (ref 5–15)
BUN: 33 mg/dL — ABNORMAL HIGH (ref 8–23)
CO2: 27 mmol/L (ref 22–32)
Calcium: 9.6 mg/dL (ref 8.9–10.3)
Chloride: 105 mmol/L (ref 98–111)
Creatinine, Ser: 1.14 mg/dL (ref 0.61–1.24)
GFR calc Af Amer: >60 mL/min (ref >60)
GFR calc non Af Amer: >60 mL/min (ref >60)
Glucose, Bld: 156 mg/dL — ABNORMAL HIGH (ref 70–99)
Potassium: 4.9 mmol/L (ref 3.5–5.1)
Sodium: 142 mmol/L (ref 135–145)

## 2020-03-09 LAB — HEMOGLOBIN A1C
Hgb A1c MFr Bld: 7.5 % — ABNORMAL HIGH (ref 4.8–5.6)
Mean Plasma Glucose: 168.55 mg/dL

## 2020-03-09 LAB — SARS CORONAVIRUS 2 (TAT 6-24 HRS): SARS Coronavirus 2: NEGATIVE

## 2020-03-10 LAB — URINE CULTURE: Culture: <10000 — AB

## 2020-03-11 NOTE — Progress Notes (Signed)
Anesthesia Chart Review   Case: [id number] Date/Time: 03/12/20 0715   Procedure: RIGHT NEPHROLITHOTOMY PERCUTANEOUS WITH SURGEON ACCESS (Right )   Anesthesia type: General   Pre-op diagnosis: RIGHT NEPHROLITHIASIS   Location: [facility] ROOM 05 / [facility]   Surgeons: Crist Fat, MD      DISCUSSION:74 y.o. never smoker with h/o HTN, DM II, CAD (CABG x3 2003, stents 2016), CHF, sleep apnea, right nephrolithiasis scheduled for above procedure 03/12/2020 with Dr. Berniece Salines.   Clearance from cardiology on chart which states pt can hold Plavix 5 days.   S/p cysto 02/04/2020 with no anesthesia complications noted.   Anticipate pt can proceed with planned procedure barring acute status change.   VS: BP (!) 158/80   Pulse 62   Temp 36.7 C (Oral)   Resp 18   Ht 5\' 6"  (1.676 m)   Wt 85.9 kg   SpO2 97%   BMI 30.57 kg/m   PROVIDERS: , MD is PCP   Carleene Cooper, MD is Cardiologist at [facility] hospital LABS: Labs reviewed: Acceptable for surgery. (all labs ordered are listed, but only abnormal results are displayed)  Labs Reviewed  URINE CULTURE - Abnormal; Notable for the following components:      Result Value   Culture   (*)    Value: <10,000 COLONIES/mL INSIGNIFICANT GROWTH Performed at [facility], [address], [place], [place]    All other components within normal limits  BASIC METABOLIC PANEL - Abnormal; Notable for the following components:   Glucose, Bld 156 (*)    BUN 33 (*)    All other components within normal limits  GLUCOSE, CAPILLARY - Abnormal; Notable for the following components:   Glucose-Capillary 149 (*)    All other components within normal limits  HEMOGLOBIN A1C - Abnormal; Notable for the following components:   Hgb A1c MFr Bld 7.5 (*)    All other components within normal limits  CBC  TYPE AND SCREEN     IMAGES:   EKG:   CV: TTE 05/15/17 Biatrial enlargement Mild left ventricular hypertrophy  Low normal left  ventricular systolic function.  EF 50-55% Normal left ventricular size Normal diastolic relaxation  Normal right ventricular size and function  Mild aortic sclerosis without stenosis Mild tricuspid regurgitation Estimated PASP 30 mmHg, normal  IVC was not visualized No pericardial effucion  No prior study for comparison   Exercise nuclear stress test 03/22/17 1. Myocardial perfusion stress study is abnormal.  There are basal and mid lateral wall perfusion defects on rest and stress images consistent with prior infarction.  Distal lateral wall perfusion is normal consistent with patient's history of bypass.  No significant reversible ischemia.  2. Overall left ventricle systolic function is normal.  The left ventricular need diastolic volume is 18mL.  The overall calculated left ventricular ejection fraction is 51%.  No left ventricular regional wall motion abnormalities 3. EKG is not interpretable due to significant artifact at peak stress.  No diagnostic ST changes in recovery.  4. Low cardiovascular risk study.   Cardiac Cath 02/20/2015  The left ventricular systolic function is normal.  Mid LAD lesion, 100% stenosed.  Dist RCA lesion, 100% stenosed.  Mid RCA-1 lesion, 95% stenosed.  Mid RCA-2 lesion, 80% stenosed.  LIMA was injected is normal in caliber, and is anatomically normal.  Radial artery .  Prox Graft lesion, between 3rd Mrg and 1st RPLB, 50% stenosed.  Dist LAD lesion, 90% stenosed. There is a 0%  residual stenosis post intervention.  A drug-eluting stent was placed.  Ost Cx lesion, 100% stenosed. There is a 0% residual stenosis post intervention.  A drug-eluting stent was placed.  LM lesion, 95% stenosed. There is a 0% residual stenosis post intervention.  A drug-eluting stent was placed.  Ost LAD to Prox LAD lesion, 95% stenosed. There is a 5% residual stenosis post intervention.  Past Medical History:  Diagnosis Date  . CAD (coronary artery disease)     a. s/p CABG x3 (LIMA->LAD, Y radial graft off of LIMA to OM3 and RPL1) ~2003;  b. 01/2015 STEMI (aVR/aVL)/PCI: LM 95/LCX 100p (3.5x15 Resolute DES covering LM/LCX), LAD 95ost/p (PTCA), 16m, 90d after LIMA insertion (2.25x32 Synergy DES), RCA 95/18m, 100d, LIMA->LAD patent, free radial comes off of LIMA and plugs into OM3 (ok) and then RPL1 (ok). 50% stenosis in graft between touchdowns. Nl EF.  [name] Depression   . Diabetes mellitus without complication (HCC)   . Essential hypertension   . History of kidney stones   . Hyperlipidemia   . Impingement of ulnar nerve, right   . Ischemic cardiomyopathy    a.  Echo 02/22/15:   EF 35-40%, mod diff HK, WMA cannot be excluded, mod LAE, mild RVE, mild to mod RAE  . Sleep apnea   . TIA (transient ischemic attack)     Past Surgical History:  Procedure Laterality Date  . CARDIAC CATHETERIZATION N/A 02/20/2015   Procedure: Left Heart Cath and Coronary Angiography;  Surgeon: Lennette Bihari, MD;  Location: [facility] INVASIVE CV LAB;  Service: Cardiovascular;  Laterality: N/A;  . CARDIAC CATHETERIZATION N/A 02/20/2015   Procedure: Coronary Stent Intervention;  Surgeon: Lennette Bihari, MD;  Location: [facility] INVASIVE CV LAB;  Service: Cardiovascular;  Laterality: N/A;  . CORONARY ARTERY BYPASS GRAFT    . CYSTOSCOPY W/ URETERAL STENT PLACEMENT Right 02/04/2020   Procedure: CYSTOSCOPY WITH RETROGRADE PYELOGRAM/URETERAL STENT PLACEMENT;  Surgeon: Crist Fat, MD;  Location: [facility] OR;  Service: Urology;  Laterality: Right;    MEDICATIONS: . Apoaequorin (PREVAGEN EXTRA STRENGTH) 20 MG CAPS  . aspirin 81 MG tablet  . clopidogrel (PLAVIX) 75 MG tablet  . Coenzyme Q10 200 MG capsule  . glipiZIDE (GLUCOTROL) 5 MG tablet  . glucosamine-chondroitin 500-400 MG tablet  . losartan (COZAAR) 100 MG tablet  . Melatonin 1 MG CAPS  . metoprolol (LOPRESSOR) 50 MG tablet  . Multiple Vitamins-Minerals (MULTIVITAMIN GUMMIES ADULT PO)  . nitroGLYCERIN (NITROSTAT) 0.4 MG SL tablet  .  rosuvastatin (CRESTOR) 20 MG tablet  . traMADol (ULTRAM) 50 MG tablet  . vitamin B-12 (CYANOCOBALAMIN) 1000 MCG tablet  . vitamin C (ASCORBIC ACID) 500 MG tablet   No current facility-administered medications for this encounter.   Jodell Cipro, PA-C [facility] Pre-Surgical Testing [phone]

## 2020-03-12 ENCOUNTER — Encounter (HOSPITAL_COMMUNITY): Payer: Self-pay | Admitting: Urology

## 2020-03-12 ENCOUNTER — Other Ambulatory Visit: Payer: Self-pay

## 2020-03-12 ENCOUNTER — Observation Stay (HOSPITAL_COMMUNITY)
Admission: RE | Admit: 2020-03-12 | Discharge: 2020-03-13 | Disposition: A | Discharge status: Home / Self Care | Payer: Medicare Other | Source: Ambulatory Visit | Attending: Urology | Admitting: Urology

## 2020-03-12 ENCOUNTER — Ambulatory Visit (HOSPITAL_COMMUNITY): Payer: Medicare Other | Admitting: Physician Assistant

## 2020-03-12 ENCOUNTER — Ambulatory Visit (HOSPITAL_COMMUNITY): Payer: Medicare Other

## 2020-03-12 ENCOUNTER — Encounter (HOSPITAL_COMMUNITY)
Admission: RE | Disposition: A | Discharge status: Home / Self Care | Payer: Self-pay | Source: Ambulatory Visit | Attending: Urology

## 2020-03-12 ENCOUNTER — Ambulatory Visit (HOSPITAL_COMMUNITY): Payer: Medicare Other | Admitting: Registered Nurse

## 2020-03-12 DIAGNOSIS — Z7984 Long term (current) use of oral hypoglycemic drugs: Secondary | ICD-10-CM | POA: Insufficient documentation

## 2020-03-12 DIAGNOSIS — I252 Old myocardial infarction: Secondary | ICD-10-CM | POA: Diagnosis not present

## 2020-03-12 DIAGNOSIS — I2510 Atherosclerotic heart disease of native coronary artery without angina pectoris: Secondary | ICD-10-CM | POA: Diagnosis not present

## 2020-03-12 DIAGNOSIS — E785 Hyperlipidemia, unspecified: Secondary | ICD-10-CM | POA: Diagnosis not present

## 2020-03-12 DIAGNOSIS — E119 Type 2 diabetes mellitus without complications: Secondary | ICD-10-CM | POA: Diagnosis not present

## 2020-03-12 DIAGNOSIS — Z7982 Long term (current) use of aspirin: Secondary | ICD-10-CM | POA: Diagnosis not present

## 2020-03-12 DIAGNOSIS — Z79899 Other long term (current) drug therapy: Secondary | ICD-10-CM | POA: Insufficient documentation

## 2020-03-12 DIAGNOSIS — N202 Calculus of kidney with calculus of ureter: Principal | ICD-10-CM | POA: Insufficient documentation

## 2020-03-12 DIAGNOSIS — M1990 Unspecified osteoarthritis, unspecified site: Secondary | ICD-10-CM | POA: Diagnosis not present

## 2020-03-12 DIAGNOSIS — N200 Calculus of kidney: Secondary | ICD-10-CM

## 2020-03-12 HISTORY — PX: NEPHROLITHOTOMY: SHX5134

## 2020-03-12 LAB — BASIC METABOLIC PANEL
Anion gap: 9 (ref 5–15)
BUN: 20 mg/dL (ref 8–23)
CO2: 22 mmol/L (ref 22–32)
Calcium: 8.1 mg/dL — ABNORMAL LOW (ref 8.9–10.3)
Chloride: 105 mmol/L (ref 98–111)
Creatinine, Ser: 1.43 mg/dL — ABNORMAL HIGH (ref 0.61–1.24)
GFR calc Af Amer: 56 mL/min — ABNORMAL LOW (ref >60)
GFR calc non Af Amer: 48 mL/min — ABNORMAL LOW (ref >60)
Glucose, Bld: 263 mg/dL — ABNORMAL HIGH (ref 70–99)
Potassium: 4.9 mmol/L (ref 3.5–5.1)
Sodium: 136 mmol/L (ref 135–145)

## 2020-03-12 LAB — CBC
HCT: 41.7 % (ref 39.0–52.0)
Hemoglobin: 13.6 g/dL (ref 13.0–17.0)
MCH: 32.6 pg (ref 26.0–34.0)
MCHC: 32.6 g/dL (ref 30.0–36.0)
MCV: 100 fL (ref 80.0–100.0)
Platelets: 242 10*3/uL (ref 150–400)
RBC: 4.17 MIL/uL — ABNORMAL LOW (ref 4.22–5.81)
RDW: 14.2 % (ref 11.5–15.5)
WBC: 10 10*3/uL (ref 4.0–10.5)
nRBC: 0 % (ref 0.0–0.2)

## 2020-03-12 LAB — TYPE AND SCREEN
ABO/RH(D): O POS
Antibody Screen: NEGATIVE

## 2020-03-12 LAB — GLUCOSE, CAPILLARY
Glucose-Capillary: 164 mg/dL — ABNORMAL HIGH (ref 70–99)
Glucose-Capillary: 200 mg/dL — ABNORMAL HIGH (ref 70–99)

## 2020-03-12 LAB — ABO/RH: ABO/RH(D): O POS

## 2020-03-12 SURGERY — NEPHROLITHOTOMY PERCUTANEOUS
Anesthesia: General | Laterality: Right

## 2020-03-12 MED ORDER — TRAMADOL HCL 50 MG PO TABS
50.0000 mg | ORAL_TABLET | Freq: Four times a day (QID) | ORAL | Status: DC | PRN
  Administered 2020-03-12: 50 mg via ORAL
  Administered 2020-03-12: 100 mg via ORAL
  Administered 2020-03-12 – 2020-03-13 (×2): 50 mg via ORAL
  Filled 2020-03-12: qty 2
  Filled 2020-03-12: qty 1

## 2020-03-12 MED ORDER — CEFAZOLIN SODIUM-DEXTROSE 2-4 GM/100ML-% IV SOLN
2.0000 g | INTRAVENOUS | Status: AC
  Administered 2020-03-12: 2 g via INTRAVENOUS
  Filled 2020-03-12: qty 100

## 2020-03-12 MED ORDER — DEXAMETHASONE SODIUM PHOSPHATE 10 MG/ML IJ SOLN
INTRAMUSCULAR | Status: AC
  Filled 2020-03-12: qty 1

## 2020-03-12 MED ORDER — FENTANYL CITRATE (PF) 100 MCG/2ML IJ SOLN
INTRAMUSCULAR | Status: DC | PRN
  Administered 2020-03-12 (×2): 25 ug via INTRAVENOUS
  Administered 2020-03-12 (×4): 50 ug via INTRAVENOUS

## 2020-03-12 MED ORDER — SUCCINYLCHOLINE CHLORIDE 200 MG/10ML IV SOSY
PREFILLED_SYRINGE | INTRAVENOUS | Status: DC | PRN
  Administered 2020-03-12: 140 mg via INTRAVENOUS

## 2020-03-12 MED ORDER — MIDAZOLAM HCL 5 MG/5ML IJ SOLN
INTRAMUSCULAR | Status: DC | PRN
  Administered 2020-03-12: 2 mg via INTRAVENOUS

## 2020-03-12 MED ORDER — DEXAMETHASONE SODIUM PHOSPHATE 4 MG/ML IJ SOLN
INTRAMUSCULAR | Status: DC | PRN
Start: 2020-03-12 — End: 2020-03-12
  Administered 2020-03-12: 4 mg via INTRAVENOUS

## 2020-03-12 MED ORDER — ONDANSETRON HCL 4 MG PO TABS: 4.0000 mg | ORAL_TABLET | Freq: Three times a day (TID) | ORAL | Status: DC | PRN

## 2020-03-12 MED ORDER — ROCURONIUM BROMIDE 10 MG/ML (PF) SYRINGE
PREFILLED_SYRINGE | INTRAVENOUS | Status: DC | PRN
  Administered 2020-03-12: 10 mg via INTRAVENOUS
  Administered 2020-03-12: 40 mg via INTRAVENOUS

## 2020-03-12 MED ORDER — ONDANSETRON HCL 4 MG/2ML IJ SOLN: 4.0000 mg | Freq: Four times a day (QID) | INTRAMUSCULAR | Status: DC | PRN

## 2020-03-12 MED ORDER — LACTATED RINGERS IV SOLN: INTRAVENOUS | Status: DC

## 2020-03-12 MED ORDER — METOPROLOL TARTRATE 25 MG PO TABS
25.0000 mg | ORAL_TABLET | Freq: Two times a day (BID) | ORAL | Status: DC
  Administered 2020-03-12 – 2020-03-13 (×2): 25 mg via ORAL
  Filled 2020-03-12 (×2): qty 1

## 2020-03-12 MED ORDER — ROCURONIUM BROMIDE 10 MG/ML (PF) SYRINGE
PREFILLED_SYRINGE | INTRAVENOUS | Status: AC
  Filled 2020-03-12: qty 10

## 2020-03-12 MED ORDER — MEPERIDINE HCL 50 MG/ML IJ SOLN: 6.2500 mg | INTRAMUSCULAR | Status: DC | PRN

## 2020-03-12 MED ORDER — ONDANSETRON HCL 4 MG/2ML IJ SOLN
INTRAMUSCULAR | Status: DC | PRN
  Administered 2020-03-12: 4 mg via INTRAVENOUS

## 2020-03-12 MED ORDER — ACETAMINOPHEN 10 MG/ML IV SOLN
1000.0000 mg | Freq: Four times a day (QID) | INTRAVENOUS | Status: AC
  Administered 2020-03-12 – 2020-03-13 (×3): 1000 mg via INTRAVENOUS
  Filled 2020-03-12 (×3): qty 100

## 2020-03-12 MED ORDER — FENTANYL CITRATE (PF) 100 MCG/2ML IJ SOLN
INTRAMUSCULAR | Status: AC
  Filled 2020-03-12: qty 2

## 2020-03-12 MED ORDER — TRAMADOL HCL 50 MG PO TABS
ORAL_TABLET | ORAL | Status: AC
  Filled 2020-03-12: qty 1

## 2020-03-12 MED ORDER — CHLORHEXIDINE GLUCONATE 0.12 % MT SOLN
15.0000 mL | Freq: Once | OROMUCOSAL | Status: AC
  Administered 2020-03-12: 15 mL via OROMUCOSAL

## 2020-03-12 MED ORDER — ACETAMINOPHEN 10 MG/ML IV SOLN
INTRAVENOUS | Status: DC | PRN
  Administered 2020-03-12: 1000 mg via INTRAVENOUS

## 2020-03-12 MED ORDER — ROSUVASTATIN CALCIUM 10 MG PO TABS
10.0000 mg | ORAL_TABLET | Freq: Every day | ORAL | Status: DC
  Administered 2020-03-13: 10 mg via ORAL
  Filled 2020-03-12: qty 1

## 2020-03-12 MED ORDER — ACETAMINOPHEN 10 MG/ML IV SOLN: 1000.0000 mg | Freq: Once | INTRAVENOUS | Status: DC | PRN

## 2020-03-12 MED ORDER — ZOLPIDEM TARTRATE 5 MG PO TABS: 5.0000 mg | ORAL_TABLET | Freq: Every evening | ORAL | Status: DC | PRN

## 2020-03-12 MED ORDER — ACETAMINOPHEN 325 MG PO TABS: 325.0000 mg | ORAL_TABLET | Freq: Once | ORAL | Status: DC | PRN

## 2020-03-12 MED ORDER — ACETAMINOPHEN 160 MG/5ML PO SOLN: 325.0000 mg | Freq: Once | ORAL | Status: DC | PRN

## 2020-03-12 MED ORDER — FENTANYL CITRATE (PF) 100 MCG/2ML IJ SOLN: 25.0000 ug | INTRAMUSCULAR | Status: DC | PRN

## 2020-03-12 MED ORDER — GENTAMICIN SULFATE 40 MG/ML IJ SOLN
5.0000 mg/kg | INTRAVENOUS | Status: AC
  Administered 2020-03-12: 360 mg via INTRAVENOUS
  Filled 2020-03-12: qty 9

## 2020-03-12 MED ORDER — EPHEDRINE SULFATE-NACL 50-0.9 MG/10ML-% IV SOSY
PREFILLED_SYRINGE | INTRAVENOUS | Status: DC | PRN
  Administered 2020-03-12 (×2): 10 mg via INTRAVENOUS

## 2020-03-12 MED ORDER — KCL IN DEXTROSE-NACL 20-5-0.9 MEQ/L-%-% IV SOLN
INTRAVENOUS | Status: DC
  Filled 2020-03-12 (×3): qty 1000

## 2020-03-12 MED ORDER — PHENYLEPHRINE HCL-NACL 20-0.9 MG/250ML-% IV SOLN
INTRAVENOUS | Status: DC | PRN
  Administered 2020-03-12: 25 ug/min via INTRAVENOUS

## 2020-03-12 MED ORDER — AMISULPRIDE (ANTIEMETIC) 5 MG/2ML IV SOLN: 10.0000 mg | Freq: Once | INTRAVENOUS | Status: DC | PRN

## 2020-03-12 MED ORDER — POTASSIUM CHLORIDE 2 MEQ/ML IV SOLN: INTRAVENOUS | Status: DC

## 2020-03-12 MED ORDER — MIDAZOLAM HCL 2 MG/2ML IJ SOLN
INTRAMUSCULAR | Status: AC
  Filled 2020-03-12: qty 2

## 2020-03-12 MED ORDER — LIDOCAINE 2% (20 MG/ML) 5 ML SYRINGE
INTRAMUSCULAR | Status: DC | PRN
  Administered 2020-03-12: 80 mg via INTRAVENOUS

## 2020-03-12 MED ORDER — SUGAMMADEX SODIUM 200 MG/2ML IV SOLN
INTRAVENOUS | Status: DC | PRN
  Administered 2020-03-12: 200 mg via INTRAVENOUS

## 2020-03-12 MED ORDER — ORAL CARE MOUTH RINSE: 15.0000 mL | Freq: Once | OROMUCOSAL | Status: AC

## 2020-03-12 MED ORDER — HYDROMORPHONE HCL 1 MG/ML IJ SOLN
0.5000 mg | INTRAMUSCULAR | Status: DC | PRN
  Administered 2020-03-12: 0.5 mg via INTRAVENOUS
  Filled 2020-03-12: qty 0.5

## 2020-03-12 MED ORDER — CHLORHEXIDINE GLUCONATE CLOTH 2 % EX PADS
6.0000 | MEDICATED_PAD | Freq: Every day | CUTANEOUS | Status: DC
  Administered 2020-03-12: 6 via TOPICAL

## 2020-03-12 MED ORDER — PHENYLEPHRINE HCL (PRESSORS) 10 MG/ML IV SOLN
INTRAVENOUS | Status: AC
  Filled 2020-03-12: qty 2

## 2020-03-12 MED ORDER — ACETAMINOPHEN 10 MG/ML IV SOLN
INTRAVENOUS | Status: AC
  Filled 2020-03-12: qty 100

## 2020-03-12 MED ORDER — NITROGLYCERIN 0.4 MG SL SUBL: 0.4000 mg | SUBLINGUAL_TABLET | SUBLINGUAL | Status: DC | PRN

## 2020-03-12 MED ORDER — SODIUM CHLORIDE 0.9 % IR SOLN
Status: DC | PRN
  Administered 2020-03-12: 9000 mL
  Administered 2020-03-12: 15000 mL
  Administered 2020-03-12: 12000 mL

## 2020-03-12 MED ORDER — ACETAMINOPHEN 10 MG/ML IV SOLN
INTRAVENOUS | Status: AC
  Administered 2020-03-12: 1000 mg via INTRAVENOUS
  Filled 2020-03-12: qty 100

## 2020-03-12 MED ORDER — LOSARTAN POTASSIUM 50 MG PO TABS
100.0000 mg | ORAL_TABLET | Freq: Every day | ORAL | Status: DC
  Administered 2020-03-12 – 2020-03-13 (×2): 100 mg via ORAL
  Filled 2020-03-12 (×2): qty 2

## 2020-03-12 MED ORDER — ONDANSETRON HCL 4 MG/2ML IJ SOLN
INTRAMUSCULAR | Status: AC
  Filled 2020-03-12: qty 2

## 2020-03-12 MED ORDER — SUCCINYLCHOLINE CHLORIDE 200 MG/10ML IV SOSY
PREFILLED_SYRINGE | INTRAVENOUS | Status: AC
  Filled 2020-03-12: qty 10

## 2020-03-12 MED ORDER — BUPIVACAINE-EPINEPHRINE (PF) 0.5% -1:200000 IJ SOLN
INTRAMUSCULAR | Status: AC
  Filled 2020-03-12: qty 30

## 2020-03-12 MED ORDER — LIDOCAINE 2% (20 MG/ML) 5 ML SYRINGE
INTRAMUSCULAR | Status: AC
  Filled 2020-03-12: qty 5

## 2020-03-12 MED ORDER — DEXAMETHASONE SODIUM PHOSPHATE 10 MG/ML IJ SOLN
INTRAMUSCULAR | Status: DC | PRN
  Administered 2020-03-12: 5 mg via INTRAVENOUS

## 2020-03-12 MED ORDER — PROPOFOL 10 MG/ML IV BOLUS
INTRAVENOUS | Status: DC | PRN
  Administered 2020-03-12: 140 mg via INTRAVENOUS

## 2020-03-12 MED ORDER — PROPOFOL 10 MG/ML IV BOLUS
INTRAVENOUS | Status: AC
  Filled 2020-03-12: qty 20

## 2020-03-12 SURGICAL SUPPLY — 53 items
BAG URINE DRAIN 2000ML AR STRL (UROLOGICAL SUPPLIES) ×2 IMPLANT
BASKET STONE NCOMPASS (UROLOGICAL SUPPLIES) IMPLANT
BASKET ZERO TIP NITINOL 2.4FR (BASKET) ×4 IMPLANT
BENZOIN TINCTURE PRP APPL 2/3 (GAUZE/BANDAGES/DRESSINGS) ×2 IMPLANT
BLADE SURG 15 STRL LF DISP TIS (BLADE) ×1 IMPLANT
BLADE SURG 15 STRL SS (BLADE) ×1
CATH AINSWORTH 30CC 24FR (CATHETERS) ×2 IMPLANT
CATH URET 5FR 28IN OPEN ENDED (CATHETERS) ×2 IMPLANT
CATH URET DUAL LUMEN 6-10FR 50 (CATHETERS) ×2 IMPLANT
CATH UROLOGY TORQUE 40 (MISCELLANEOUS) ×2 IMPLANT
CATH X-FORCE N30 NEPHROSTOMY (TUBING) ×2 IMPLANT
CHLORAPREP W/TINT 26 (MISCELLANEOUS) ×2 IMPLANT
COVER WAND RF STERILE (DRAPES) IMPLANT
DRAPE C-ARM 42X120 X-RAY (DRAPES) ×2 IMPLANT
DRAPE LINGEMAN PERC (DRAPES) ×2 IMPLANT
DRSG PAD ABDOMINAL 8X10 ST (GAUZE/BANDAGES/DRESSINGS) ×2 IMPLANT
DRSG TEGADERM 8X12 (GAUZE/BANDAGES/DRESSINGS) ×4 IMPLANT
EXTRACTOR STONE 1.7FRX115CM (UROLOGICAL SUPPLIES) IMPLANT
GAUZE SPONGE 4X4 12PLY STRL (GAUZE/BANDAGES/DRESSINGS) ×2 IMPLANT
GLOVE BIOGEL M STRL SZ7.5 (GLOVE) ×2 IMPLANT
GOWN STRL REUS W/TWL XL LVL3 (GOWN DISPOSABLE) ×2 IMPLANT
GUIDEWIRE AMPLAZ .035X145 (WIRE) ×4 IMPLANT
GUIDEWIRE ANG ZIPWIRE 038X150 (WIRE) IMPLANT
GUIDEWIRE STR DUAL SENSOR (WIRE) ×2 IMPLANT
HOLDER NEEDLE AMPLATZ W/INSERT (MISCELLANEOUS) IMPLANT
IV NS IRRIG 3000ML ARTHROMATIC (IV SOLUTION) ×24 IMPLANT
IV SET EXTENSION CATH 6 NF (IV SETS) ×2 IMPLANT
KIT BASIN OR (CUSTOM PROCEDURE TRAY) ×2 IMPLANT
KIT PROBE 340X3.4XDISP GRN (MISCELLANEOUS) IMPLANT
KIT PROBE TRILOGY 3.4X340 (MISCELLANEOUS)
KIT PROBE TRILOGY 3.9X350 (MISCELLANEOUS) IMPLANT
KIT TURNOVER KIT A (KITS) IMPLANT
LASER FIB FLEXIVA PULSE ID 365 (Laser) ×2 IMPLANT
MANIFOLD NEPTUNE II (INSTRUMENTS) ×2 IMPLANT
NEEDLE SPNL 20GX3.5 QUINCKE YW (NEEDLE) IMPLANT
NEEDLE TROCAR 18X15 ECHO (NEEDLE) IMPLANT
NEEDLE TROCAR 18X20 (NEEDLE) IMPLANT
NS IRRIG 1000ML POUR BTL (IV SOLUTION) ×2 IMPLANT
PACK CYSTO (CUSTOM PROCEDURE TRAY) ×2 IMPLANT
SHEATH PEELAWAY SET 9 (SHEATH) IMPLANT
SPONGE LAP 4X18 RFD (DISPOSABLE) ×2 IMPLANT
STENT URET 6FRX24 CONTOUR (STENTS) ×2 IMPLANT
SUT ETHILON 3 0 PS 1 (SUTURE) ×2 IMPLANT
SYR 10ML LL (SYRINGE) ×2 IMPLANT
SYR 20ML LL LF (SYRINGE) ×4 IMPLANT
SYR 50ML LL SCALE MARK (SYRINGE) ×2 IMPLANT
TOWEL OR 17X26 10 PK STRL BLUE (TOWEL DISPOSABLE) ×2 IMPLANT
TRACTIP FLEXIVA PULS ID 200XHI (Laser) IMPLANT
TRACTIP FLEXIVA PULSE ID 200 (Laser)
TRAY FOLEY MTR SLVR 16FR STAT (SET/KITS/TRAYS/PACK) ×2 IMPLANT
TUBING CONNECTING 10 (TUBING) ×4 IMPLANT
TUBING STONE CATCHER TRILOGY (MISCELLANEOUS) ×2 IMPLANT
TUBING UROLOGY SET (TUBING) ×2 IMPLANT

## 2020-03-12 NOTE — Anesthesia Preprocedure Evaluation (Addendum)
Anesthesia Evaluation  Patient identified by MRN, date of birth, ID band Patient awake    Reviewed: Allergy & Precautions, NPO status , Patient's Chart, lab work & pertinent test results, reviewed documented beta blocker date and time   Airway Mallampati: IV  TM Distance: <3 FB Neck ROM: Full    Dental  (+) Teeth Intact, Dental Advisory Given   Pulmonary sleep apnea ,    breath sounds clear to auscultation       Cardiovascular hypertension, Pt. on medications and Pt. on home beta blockers + CAD, + Past MI, + Cardiac Stents and + CABG   Rhythm:Regular Rate:Normal     Neuro/Psych PSYCHIATRIC DISORDERS Depression TIA   GI/Hepatic negative GI ROS,   Endo/Other  diabetes, Type 2, Oral Hypoglycemic Agents  Renal/GU      Musculoskeletal   Abdominal (+) + obese,   Peds  Hematology   Anesthesia Other Findings   Reproductive/Obstetrics                           Anesthesia Physical Anesthesia Plan  ASA: III  Anesthesia Plan: General   Post-op Pain Management:    Induction: Intravenous  PONV Risk Score and Plan: 3 and Ondansetron and Treatment may vary due to age or medical condition  Airway Management Planned: Oral ETT and Video Laryngoscope Planned  Additional Equipment: None  Intra-op Plan:   Post-operative Plan: Extubation in OR  Informed Consent: I have reviewed the patients History and Physical, chart, labs and discussed the procedure including the risks, benefits and alternatives for the proposed anesthesia with the patient or authorized representative who has indicated his/her understanding and acceptance.     Dental advisory given  Plan Discussed with: CRNA  Anesthesia Plan Comments: (Echo:  - Left ventricle: The cavity size was normal. Wall thickness was  normal. Systolic function was normal. The estimated ejection  fraction was in the range of 50% to 55%. Wall motion  was normal;  there were no regional wall motion abnormalities. Features are  consistent with a pseudonormal left ventricular filling pattern,  with concomitant abnormal relaxation and increased filling  pressure (grade 2 diastolic dysfunction).  - Left atrium: The atrium was mildly dilated.  - Right ventricle: The cavity size was mildly dilated.  - Right atrium: The atrium was mildly to moderately dilated.  - Pulmonary arteries: Systolic pressure was mildly increased. PA  peak pressure: 33 mm Hg (S). )       Anesthesia Quick Evaluation

## 2020-03-12 NOTE — Interval H&P Note (Signed)
History and Physical Interval Note:  03/12/2020 7:35 AM  Paul Levine  has presented today for surgery, with the diagnosis of RIGHT NEPHROLITHIASIS.  The various methods of treatment have been discussed with the patient and family. After consideration of risks, benefits and other options for treatment, the patient has consented to  Procedure(s): RIGHT NEPHROLITHOTOMY PERCUTANEOUS WITH SURGEON ACCESS (Right) as a surgical intervention.  The patient's history has been reviewed, patient examined, no change in status, stable for surgery.  I have reviewed the patient's chart and labs.  Questions were answered to the patient's satisfaction.     Crist Fat

## 2020-03-12 NOTE — H&P (Signed)
46M otherwise reasonably healthy who had a stent placed for a large right ureteral stone on July 6th. He also had a large stone burden in the right collecting system. He tolerated the stent well. He presents today for further discussion of management for his large right sided stone burden. The patient tolerated the stent very well. He felt immediate relief of the pressure. He has not had any voiding symptoms. He has not had any gross hematuria. He has done very well over the interval.   The patient has a history of heart disease and had 2 stents placed 4 years ago. He takes Plavix for this. He has atherosclerosis, but is otherwise a reasonably healthy person. He gets all of his care from the [place].     ALLERGIES: metformin - Stomach pain    MEDICATIONS: Metoprolol Succinate 50 mg tablet, extended release 24 hr  Clopidogrel 75 mg tablet  Losartan Potassium 100 mg tablet  Rosuvastatin Calcium 10 mg tablet 1/2 tablet PO Daily     GU PSH: Cystoscopy Insert Stent, Right - 02/04/2020     NON-GU PSH: None   GU PMH: None   NON-GU PMH: Diabetes Type 2 Heart disease, unspecified Hypercholesterolemia Hypertension Myocardial Infarction Sleep Apnea    FAMILY HISTORY: 1 Daughter - Daughter 2 sons - Son   SOCIAL HISTORY: Marital Status: Widowed Preferred Language: English; Ethnicity: Not Hispanic Or Latino; Race: White Current Smoking Status: Patient has never smoked.   Tobacco Use Assessment Completed: Used Tobacco in last 30 days? Drinks 1 drink per week.  Drinks 1 caffeinated drink per day. Patient's occupation is/was Retired.    REVIEW OF SYSTEMS:    GU Review Male:   Patient reports frequent urination and erection problems. Patient denies hard to postpone urination, burning/ pain with urination, get up at night to urinate, leakage of urine, stream starts and stops, trouble starting your stream, have to strain to urinate , and penile pain.  Gastrointestinal (Upper):   Patient denies  vomiting, indigestion/ heartburn, and nausea.  Gastrointestinal (Lower):   Patient denies diarrhea and constipation.  Constitutional:   Patient denies fever, night sweats, weight loss, and fatigue.  Skin:   Patient denies skin rash/ lesion and itching.  Eyes:   Patient denies blurred vision and double vision.  Ears/ Nose/ Throat:   Patient denies sore throat and sinus problems.  Hematologic/Lymphatic:   Patient denies swollen glands and easy bruising.  Cardiovascular:   Patient denies leg swelling and chest pains.  Respiratory:   Patient denies cough and shortness of breath.  Endocrine:   Patient denies excessive thirst.  Musculoskeletal:   Patient denies back pain and joint pain.  Neurological:   Patient denies headaches and dizziness.  Psychologic:   Patient denies depression and anxiety.   VITAL SIGNS:      02/18/2020 12:07 PM  Weight 180 lb / 81.65 kg  Height 66 in / 167.64 cm  BP 104/68 mmHg  Pulse 71 /min  Temperature 98.7 F / 37.0 C  BMI 29.0 kg/m   MULTI-SYSTEM PHYSICAL EXAMINATION:    Constitutional: Well-nourished. No physical deformities. Normally developed. Good grooming.  Respiratory: Normal breath sounds. No labored breathing, no use of accessory muscles.   Cardiovascular: Regular rate and rhythm. No murmur, no gallop. Normal temperature, normal extremity pulses, no swelling, no varicosities.   Musculoskeletal: Normal gait and station of head and neck.     Complexity of Data:  Source Of History:  Patient  Records Review:   Previous Doctor  Records, Previous Hospital Records, Previous Patient Records, POC Tool  Urine Test Review:   Urinalysis  X-Ray Review: C.T. Abdomen/Pelvis: Reviewed Films. Discussed With Patient.     PROCEDURES:          Urinalysis w/Scope Dipstick Dipstick Cont'd Micro  Color: Amber Bilirubin: Neg mg/dL WBC/hpf: 6 - 77/SFS  Appearance: Cloudy Ketones: Neg mg/dL RBC/hpf: >23/TRV  Specific Gravity: 1.025 Blood: 3+ ery/uL Bacteria: Few  (10-25/hpf)  pH: 5.5 Protein: 2+ mg/dL Cystals: NS (Not Seen)  Glucose: Neg mg/dL Urobilinogen: 0.2 mg/dL Casts: NS (Not Seen)    Nitrites: Neg Trichomonas: Not Present    Leukocyte Esterase: 1+ leu/uL Mucous: Not Present      Epithelial Cells: 0 - 5/hpf      Yeast: NS (Not Seen)      Sperm: Not Present    ASSESSMENT:      ICD-10 Details  1 GU:   Renal and ureteral calculus - N20.2    PLAN:           Orders Labs Urine Culture          Schedule Return Visit/Planned Activity: ASAP - Schedule Surgery          Document Letter(s):  Created for Patient: Clinical Summary         Notes:   The patient has a very large stone burden in his right collecting system including a 2.5 cm stone in the right renal pelvis and a nearly 3 cm x 1.2 cm stone in the right distal ureter. Fortunately the stent was easily placed in the patient has done great over the past 2 weeks. However given the large stone burden I recommended the treatment be a percutaneous nephrolithotomy. I went through the rationale for this as well as the procedure itself. I explained the operation, the expected recovery, and the postop expectations including the risks and the benefits. The patient would like to proceed, but prior to proceeding we need to have the patient stop his Plavix. We will reach out to his primary care provider who is in the [place] system for clearance.

## 2020-03-12 NOTE — Anesthesia Procedure Notes (Signed)
Procedure Name: Intubation Date/Time: 03/12/2020 7:47 AM Performed by: Elisabeth Cara, CRNA Pre-anesthesia Checklist: Patient identified, Emergency Drugs available, Suction available, Patient being monitored and Timeout performed Patient Re-evaluated:Patient Re-evaluated prior to induction Oxygen Delivery Method: Circle system utilized Preoxygenation: Pre-oxygenation with 100% oxygen Induction Type: IV induction Laryngoscope Size: Glidescope and 3 Grade View: Grade I Tube type: Oral Tube size: 7.5 mm Airway Equipment and Method: Video-laryngoscopy and Stylet Placement Confirmation: ETT inserted through vocal cords under direct vision,  positive ETCO2 and breath sounds checked- equal and bilateral Secured at: 21 cm Tube secured with: Tape Dental Injury: Teeth and Oropharynx as per pre-operative assessment  Comments: Elective glidescope due to large tongue and small mouth opening. Smooth IV induction. Grade 1 view, + ETCO, BBS=. ATOI.

## 2020-03-12 NOTE — Transfer of Care (Signed)
Immediate Anesthesia Transfer of Care Note  Patient: Paul Levine  Procedure(s) Performed: RIGHT NEPHROLITHOTOMY PERCUTANEOUS WITH SURGEON ACCESS (Right )  Patient Location: PACU  Anesthesia Type:General  Level of Consciousness: awake, alert , oriented and patient cooperative  Airway & Oxygen Therapy: Patient Spontanous Breathing and Patient connected to face mask oxygen  Post-op Assessment: Report given to RN, Post -op Vital signs reviewed and stable and Patient moving all extremities  Post vital signs: Reviewed and stable  Last Vitals:  Vitals Value Taken Time  BP 137/85 03/12/20 1154  Temp    Pulse 73 03/12/20 1156  Resp 17 03/12/20 1156  SpO2 97 % 03/12/20 1156  Vitals shown include unvalidated device data.  Last Pain:  Vitals:   03/12/20 0620  TempSrc:   PainSc: 0-No pain         Complications: No complications documented.

## 2020-03-12 NOTE — Op Note (Signed)
Pre-operative diagnosis: right sided nephrolithiasis, >2.0 centimeters  Post-operative diagnosis: as above   Procedure performed:  cystoscopy, right retrograde pyelogram with interpretation, right percutaneous renal access, right nephrolithotomy, right nephrostogram,   right ureteral stent exchange  Surgeon: Dr. Ardis Hughs  Assistant: none  Anesthesia: General  Complications: None  Specimens: Several small fragments of the stone extracted will be sent to [facility] for stone composition analysis  Findings: Large ureteral stone, large UPJ stone, large stone in upper pole Small medial mid-ureteral tear  EBL: Approximately  650 cc  Specimens: stone from collecting system - taken to [facility] lab  Indication: Indication: Paul Levine is a 74 y.o.  patient with  a large burden right nephrolithiasis. The patient presented today for follow-up definitive management. After  reviewing the management options for treatment, elected to proceed with the above surgical procedure(s). We have discussed the potential benefits and risks of the procedure, side effects of the proposed treatment, the likelihood of the patient achieving the goals of the procedure, and any potential problems that might occur during the procedure or recuperation. Informed consent has been obtained.   Description:  Consent was obtained in the preoperative holding area. The patient was marked appropriately and then taken back to the operating room where they were intubated on the gurney. The patient was flipped prone onto the OR table. Large jelly rolls were placed in the anterior axillary line on both sides allowing the patient's chest and abdomen to fall inbetween. The patient was then prepped and draped in the routine sterile fashion in the right flank. A timeout was then held confirming the proper side and procedure as well as antibiotics were administered.  I then used the flexible  cystoscope and passed gently into the patient's urethra under visual guidance.  Once into the bladder I grasped the stent emanating from the patient's right ureteral orifice and pull it to the urethral meatus. I then passed a wire through the stent and up into the right collecting system. I then removed the stent over the wire and exchanged for a 5 [place] open-ended ureteral Pollock catheter. The Pollack catheter was then advanced up to the UPJ. The wire was then removed and a retrograde pyelogram was performed with the above findings. I then turned my attention to the patient's right flank and obtaining percutaneous renal access.  Using the C-arm rotated at 25 and the bulls-eye technique with an 18-gauge coaxial needle the  lower posterior lateral calyx was targeted. Then rotating the C-arm AP depth of our needle was noted to be within the calyx and the inner part of the coaxial needle was removed. Urine was noted to return. A 0.038 sensor wire was then passed through the sheath of the coaxial needle and into the right renal collecting system. The wire was then passed down the ureter and into the bladder using fluoroscopic guide and the sheath of the needle was removed. An angiographic catheter was then advanced into the bladder and the wire removed. A Super Stiff wire was then passed into the angiographic catheter and the angiographic catheter removed. A 9 French peel-away dilator was then advanced over the Super Stiff wire and passed into the renal pelvis and across the UPJ under fluoroscopic guidance. The inner part of this dilator was removed and the 0.38 sensor wire was passed alongside the Super Stiff wire through the dilator and into the right ureter and down into the bladder. The angiographic catheter again was passed over the guidewire  and advanced into the bladder, the wire was then removed. A Super Stiff wire was then passed through angiographic catheter and angiographic catheter removed. The outer  part of the sheath was then removed, establishing 2 superstiff wires through the targeted calyx and into the bladder.   The 35 French NephroMax balloon was then passed over one of the Super Stiff wires and the tip guided down into the targeted calyx. The balloon was then inflated to approximately 12 atm, and once there was no waist noted under fluoroscopy the access sheath was advanced over the balloon. The balloon was then removed. The wires were then placed back into the sheaths and snapped to the drape.   Using the rigid nephroscope to explore the targeted calyx and kidney stones were in countered and taken care of using the Triology.  Then using a flexible cystoscope to navigate the remaining calyces of the kidney multiple smaller stone fragments encountered.  Using the 0 tip basket these fragments were grabbed and removed. Contrast was injected through the cystoscope and the calyces systematically inspected under fluoroscopic guidance to ensure that all stone fragments had been removed.   Then using the flexible ureteroscope the ureter was navigated in antegrade fashion. All stone fragments were pushed from the ureter into the bladder. Once the ureter was clear the scope was advanced into the bladder and a 0.038 sensor wire was left in the bladder and the scope backed out over wire. The sensor wire was then backloaded over the rigid nephroscope using the stent pusher and a  24 cm x 6 French double-J ureteral stent was passed antegrade over the sensor wire down into the bladder under fluoroscopic guidance. Once the stent was in the bladder the wire was gently pulled back and a nice curl noted in the bladder. The wire completely removed from the stent, and nice curl on the proximal end of the stent was noted in the renal pelvis. The sheath was then backed out slowly to ensure that all calyces had been inspected and there was nothing behind the sheath.   A 28F ainsworth tip catheter was then passed over  one of the Super Stiff wires through the sheath and into the renal pelvis. The sheath was then backed out of the kidney and cut over the red rubber catheter. A nephrostogram was then performed confirming the position of our nephrostomy tube and reassuring that there were no longer any filling defects from the patient's symptoms.  After several minutes of direct pressure and observation was noted that there was no significant bleeding from the nephrostomy tube or around the nephrostomy tube tract.  As such, I remove the nephrostomy tube as well as the safety wire. 25 cc of local anesthesia was then injected into the patient's wound, and the wound was closed with 3-0 nylon in 2 vertical mattress sutures. The incision was then padded using a bundle of 4 x 4's and Hypafix tape. Patient was subsequently rolled over to the supine position and extubated. The patient was returned to the PACU in excellent condition. At the end of the case all lap and needle and sponges were accounted for. There are no perioperative complications.

## 2020-03-13 ENCOUNTER — Encounter (HOSPITAL_COMMUNITY): Payer: Self-pay | Admitting: Urology

## 2020-03-13 ENCOUNTER — Observation Stay (HOSPITAL_COMMUNITY): Payer: Medicare Other

## 2020-03-13 DIAGNOSIS — N202 Calculus of kidney with calculus of ureter: Secondary | ICD-10-CM | POA: Diagnosis not present

## 2020-03-13 LAB — BASIC METABOLIC PANEL
Anion gap: 11 (ref 5–15)
BUN: 25 mg/dL — ABNORMAL HIGH (ref 8–23)
CO2: 20 mmol/L — ABNORMAL LOW (ref 22–32)
Calcium: 8.1 mg/dL — ABNORMAL LOW (ref 8.9–10.3)
Chloride: 105 mmol/L (ref 98–111)
Creatinine, Ser: 1.69 mg/dL — ABNORMAL HIGH (ref 0.61–1.24)
GFR calc Af Amer: 46 mL/min — ABNORMAL LOW (ref >60)
GFR calc non Af Amer: 39 mL/min — ABNORMAL LOW (ref >60)
Glucose, Bld: 246 mg/dL — ABNORMAL HIGH (ref 70–99)
Potassium: 4.7 mmol/L (ref 3.5–5.1)
Sodium: 136 mmol/L (ref 135–145)

## 2020-03-13 LAB — CBC
HCT: 36.7 % — ABNORMAL LOW (ref 39.0–52.0)
Hemoglobin: 12.2 g/dL — ABNORMAL LOW (ref 13.0–17.0)
MCH: 32.9 pg (ref 26.0–34.0)
MCHC: 33.2 g/dL (ref 30.0–36.0)
MCV: 98.9 fL (ref 80.0–100.0)
Platelets: 249 10*3/uL (ref 150–400)
RBC: 3.71 MIL/uL — ABNORMAL LOW (ref 4.22–5.81)
RDW: 14.4 % (ref 11.5–15.5)
WBC: 10 10*3/uL (ref 4.0–10.5)
nRBC: 0 % (ref 0.0–0.2)

## 2020-03-13 MED ORDER — TRAMADOL HCL 50 MG PO TABS: 50.0000 mg | ORAL_TABLET | Freq: Four times a day (QID) | ORAL | 0 refills | Status: DC | PRN

## 2020-03-13 MED ORDER — CIPROFLOXACIN HCL 500 MG PO TABS: 500.0000 mg | ORAL_TABLET | Freq: Two times a day (BID) | ORAL | 0 refills | Status: DC

## 2020-03-13 MED ORDER — CIPROFLOXACIN HCL 500 MG PO TABS
500.0000 mg | ORAL_TABLET | Freq: Once | ORAL | Status: AC
  Administered 2020-03-13: 500 mg via ORAL
  Filled 2020-03-13: qty 1

## 2020-03-13 MED ORDER — CLOPIDOGREL BISULFATE 75 MG PO TABS: 75.0000 mg | ORAL_TABLET | Freq: Every day | ORAL | Status: AC

## 2020-03-13 NOTE — Discharge Instructions (Signed)
Discharge instructions following PCNL  Call your doctor for: Fevers greater than 100.5 Severe nausea or vomiting Increasing pain not controlled by pain medication Increasing redness or drainage from incisions Decreased urine output or a catheter is no longer draining  The number for questions is [phone].  Bathing and dressing changes: You should not shower for 48 hours after surgery.  Do not soak your back in a bathtub.  Diet: It is extremely important to drink plenty of fluids after surgery, especially water.  You may resume your regular diet, unless otherwise instructed.  Medications: May take Tylenol (acetaminophen) or ibuprofen (Advil, Motrin) as directed over-the-counter. Take any prescriptions as directed.  Follow-up appointments: Follow-up appointment will be scheduled with Dr. Marlou Porch in 10-14 days for hospital check and stent removal.

## 2020-03-13 NOTE — Anesthesia Postprocedure Evaluation (Signed)
Anesthesia Post Note  Patient: Becky Berberian  Procedure(s) Performed: RIGHT NEPHROLITHOTOMY PERCUTANEOUS WITH SURGEON ACCESS (Right )     Patient location during evaluation: PACU Anesthesia Type: General Level of consciousness: awake and alert Pain management: pain level controlled Vital Signs Assessment: post-procedure vital signs reviewed and stable Respiratory status: spontaneous breathing, nonlabored ventilation, respiratory function stable and patient connected to nasal cannula oxygen Cardiovascular status: blood pressure returned to baseline and stable Postop Assessment: no apparent nausea or vomiting Anesthetic complications: no   No complications documented.  Last Vitals:  Vitals:   03/13/20 0216 03/13/20 0450  BP: (!) 151/88 (!) 144/98  Pulse: 66 64  Resp: 18 18  Temp: 36.7 C 36.8 C  SpO2: 96% 95%    Last Pain:  Vitals:   03/13/20 0840  TempSrc:   PainSc: 4                  Shelton Silvas

## 2020-03-13 NOTE — Progress Notes (Signed)
Went over discharge papers with patient and family.  All questions answered.  VSS.  Patient voiding with no issues post foley removal.  Pt wheeled out via NT.

## 2020-03-13 NOTE — Discharge Summary (Signed)
Date of admission: 03/12/2020  Date of discharge: 03/13/2020  Admission diagnosis: large right kidney stones  Discharge diagnosis: same  Secondary diagnoses:  Patient Active Problem List   Diagnosis Date Noted  . Nephrolithiasis 03/12/2020  . Ureteral stone 02/04/2020  . Urolithiasis 02/04/2020  . OSA on CPAP 11/04/2015  . Hyperlipidemia LDL goal <70 03/18/2015  . Essential hypertension   . Acute ST elevation myocardial infarction (STEMI) ([place]) 02/20/2015  . CAD (coronary artery disease) 02/20/2015  . Diabetes mellitus ([place]) 02/20/2015  . ST elevation myocardial infarction (STEMI) involving other coronary artery of anterior wall ([place]) 02/20/2015  . STEMI (ST elevation myocardial infarction) ([place]) 02/20/2015    Procedures performed: Procedure(s): RIGHT NEPHROLITHOTOMY PERCUTANEOUS WITH SURGEON ACCESS  History and Physical: For full details, please see admission history and physical. Briefly, Paul Levine is a 74 y.o. year old patient with large right sided urolithiasis.  He was stented several weeks prior to his presentation for surgery.   Hospital Course: Patient tolerated the procedure well.  He was then transferred to the floor after an uneventful PACU stay.  His hospital course was uncomplicated.  On POD#1 he had met discharge criteria: was eating a regular diet, was up and ambulating independently,  pain was well controlled, was voiding without a catheter, and was ready to for discharge.  CT scan was performed and showed a residual stone in the adjacent calyx to his PCNL access.  There was irrigate that traveled along the retroperitoneum with a few small stone fragments outside of the ureter along the proximal portion.   Physical Exam: NAD Vitals:   03/12/20 2200 03/12/20 2209 03/13/20 0216 03/13/20 0450  BP: (!) 188/100  (!) 151/88 (!) 144/98  Pulse: 70  66 64  Resp: 18 18 18 18   Temp: (!) 97.5 F (36.4 C)  98.1 F (36.7 C) 98.2 F (36.8 C)  TempSrc: Oral  Oral Oral   SpO2: 95%  96% 95%  Weight:      Non-labored breathing tenderness to palpation in the right lower quadrant. Extremities symmetric   Laboratory values:  Recent Labs    03/12/20 1645 03/13/20 0524  WBC 10.0 10.0  HGB 13.6 12.2*  HCT 41.7 36.7*   Recent Labs    03/12/20 1645 03/13/20 0524  NA 136 136  K 4.9 4.7  CL 105 105  CO2 22 20*  GLUCOSE 263* 246*  BUN 20 25*  CREATININE 1.43* 1.69*  CALCIUM 8.1* 8.1*   No results for input(s): LABPT, INR in the last 72 hours. No results for input(s): LABURIN in the last 72 hours. Results for orders placed or performed during the hospital encounter of 03/09/20  SARS CORONAVIRUS 2 (TAT 6-24 HRS) Nasopharyngeal Nasopharyngeal Swab     Status: None   Collection Time: 03/09/20 10:37 AM   Specimen: Nasopharyngeal Swab  Result Value Ref Range Status   SARS Coronavirus 2 NEGATIVE NEGATIVE Final    Comment: (NOTE) SARS-CoV-2 target nucleic acids are NOT DETECTED.  The SARS-CoV-2 RNA is generally detectable in upper and lower respiratory specimens during the acute phase of infection. Negative results do not preclude SARS-CoV-2 infection, do not rule out co-infections with other pathogens, and should not be used as the sole basis for treatment or other patient management decisions. Negative results must be combined with clinical observations, patient history, and epidemiological information. The expected result is Negative.  Fact Sheet for Patients: SugarRoll.be  Fact Sheet for Healthcare Providers: https://www.woods-mathews.com/  This test is not yet approved or cleared  by the [place] and  has been authorized for detection and/or diagnosis of SARS-CoV-2 by FDA under an Emergency Use Authorization (EUA). This EUA will remain  in effect (meaning this test can be used) for the duration of the COVID-19 declaration under Se ction 564(b)(1) of the Act, 21 U.S.C. section  360bbb-3(b)(1), unless the authorization is terminated or revoked sooner.  Performed at [facility], [place] [address], [place], [place] [address]     Disposition: Home  Discharge instruction: The patient was instructed to be ambulatory but told to refrain from heavy lifting, strenuous activity, or driving.   Discharge medications:  Allergies as of 03/13/2020      Reactions   Metformin And Related Other (See Comments)   GI upset      Medication List    TAKE these medications   aspirin 81 MG tablet Take 81 mg by mouth daily.   ciprofloxacin 500 MG tablet Commonly known as: CIPRO Take 1 tablet (500 mg total) by mouth 2 (two) times daily.   clopidogrel 75 MG tablet Commonly known as: PLAVIX Take 1 tablet (75 mg total) by mouth daily. Restart on 8/16 What changed: additional instructions   Coenzyme Q10 200 MG capsule Take 200 mg by mouth daily.   glipiZIDE 5 MG tablet Commonly known as: GLUCOTROL Take 5 mg by mouth 2 (two) times daily before a meal.   glucosamine-chondroitin 500-400 MG tablet Take 1 tablet by mouth daily.   losartan 100 MG tablet Commonly known as: COZAAR Take 100 mg by mouth daily.   Melatonin 1 MG Caps Take 1 mg by mouth at bedtime.   metoprolol tartrate 50 MG tablet Commonly known as: LOPRESSOR Take 25 mg by mouth 2 (two) times daily.   MULTIVITAMIN GUMMIES ADULT PO Take 4 tablets by mouth daily.   nitroGLYCERIN 0.4 MG SL tablet Commonly known as: Nitrostat Place 1 tablet (0.4 mg total) under the tongue every 5 (five) minutes as needed for chest pain.   Prevagen Extra Strength 20 MG Caps Generic drug: Apoaequorin Take 20 mg by mouth daily.   rosuvastatin 20 MG tablet Commonly known as: CRESTOR Take 10 mg by mouth daily.   traMADol 50 MG tablet Commonly known as: Ultram Take 1-2 tablets (50-100 mg total) by mouth every 6 (six) hours as needed for moderate pain.   vitamin B-12 1000 MCG tablet Commonly known as:  CYANOCOBALAMIN Take 1,000 mcg by mouth daily.   vitamin C 500 MG tablet Commonly known as: ASCORBIC ACID Take 500 mg by mouth daily.       Followup:   Follow-up Information    Ardis Hughs, MD On 03/26/2020.   Specialty: Urology Why: Stent removal, 2:30pm Contact information: [place] [address] [phone]

## 2020-10-16 ENCOUNTER — Ambulatory Visit (INDEPENDENT_AMBULATORY_CARE_PROVIDER_SITE_OTHER): Payer: Medicare Other | Admitting: Psychologist

## 2020-10-16 DIAGNOSIS — F320 Major depressive disorder, single episode, mild: Secondary | ICD-10-CM

## 2020-11-02 ENCOUNTER — Ambulatory Visit (INDEPENDENT_AMBULATORY_CARE_PROVIDER_SITE_OTHER): Payer: Medicare Other | Admitting: Psychologist

## 2020-11-02 DIAGNOSIS — F320 Major depressive disorder, single episode, mild: Secondary | ICD-10-CM

## 2020-11-25 ENCOUNTER — Other Ambulatory Visit: Payer: Self-pay | Admitting: Orthopedic Surgery

## 2020-12-17 ENCOUNTER — Other Ambulatory Visit: Payer: Self-pay

## 2020-12-17 ENCOUNTER — Encounter (HOSPITAL_BASED_OUTPATIENT_CLINIC_OR_DEPARTMENT_OTHER): Payer: Self-pay | Admitting: Orthopedic Surgery

## 2020-12-17 NOTE — Progress Notes (Signed)
Pt history reviewed with Dr. Arby Barrette, anesthesiologist. Almira Coaster that is is okay to continue Aspirin as instructed by Dr. Andee Poles, cardiologist.

## 2020-12-23 NOTE — Anesthesia Preprocedure Evaluation (Addendum)
Anesthesia Evaluation  Patient identified by MRN, date of birth, ID band Patient awake    Reviewed: Allergy & Precautions, NPO status , Patient's Chart, lab work & pertinent test results, reviewed documented beta blocker date and time   Airway Mallampati: IV  TM Distance: <3 FB Neck ROM: Full    Dental no notable dental hx. (+) Teeth Intact, Dental Advisory Given   Pulmonary sleep apnea and Continuous Positive Airway Pressure Ventilation ,    Pulmonary exam normal breath sounds clear to auscultation       Cardiovascular hypertension, Pt. on medications and Pt. on home beta blockers + CAD, + Past MI, + Cardiac Stents and + CABG  Normal cardiovascular exam Rhythm:Regular Rate:Normal     Neuro/Psych PSYCHIATRIC DISORDERS Depression TIA   GI/Hepatic negative GI ROS,   Endo/Other  diabetes, Type 2, Oral Hypoglycemic Agents  Renal/GU      Musculoskeletal   Abdominal (+) - obese,   Peds  Hematology   Anesthesia Other Findings   Reproductive/Obstetrics                           Anesthesia Physical  Anesthesia Plan  ASA: III  Anesthesia Plan: General   Post-op Pain Management:    Induction: Intravenous  PONV Risk Score and Plan: 3 and Ondansetron and Treatment may vary due to age or medical condition  Airway Management Planned: LMA and Oral ETT  Additional Equipment: None  Intra-op Plan:   Post-operative Plan: Extubation in OR  Informed Consent: I have reviewed the patients History and Physical, chart, labs and discussed the procedure including the risks, benefits and alternatives for the proposed anesthesia with the patient or authorized representative who has indicated his/her understanding and acceptance.     Dental advisory given  Plan Discussed with: CRNA and Anesthesiologist  Anesthesia Plan Comments: (Echo:  - Left ventricle: The cavity size was normal. Wall thickness was   normal. Systolic function was normal. The estimated ejection  fraction was in the range of 50% to 55%. Wall motion was normal;  there were no regional wall motion abnormalities. Features are  consistent with a pseudonormal left ventricular filling pattern,  with concomitant abnormal relaxation and increased filling  pressure (grade 2 diastolic dysfunction).  - Left atrium: The atrium was mildly dilated.  - Right ventricle: The cavity size was mildly dilated.  - Right atrium: The atrium was mildly to moderately dilated.  - Pulmonary arteries: Systolic pressure was mildly increased. PA  peak pressure: 33 mm Hg (S). )       Anesthesia Quick Evaluation

## 2020-12-24 ENCOUNTER — Ambulatory Visit (HOSPITAL_BASED_OUTPATIENT_CLINIC_OR_DEPARTMENT_OTHER): Payer: No Typology Code available for payment source | Admitting: Anesthesiology

## 2020-12-24 ENCOUNTER — Other Ambulatory Visit: Payer: Self-pay

## 2020-12-24 ENCOUNTER — Encounter (HOSPITAL_BASED_OUTPATIENT_CLINIC_OR_DEPARTMENT_OTHER): Payer: Self-pay | Admitting: Orthopedic Surgery

## 2020-12-24 ENCOUNTER — Encounter (HOSPITAL_BASED_OUTPATIENT_CLINIC_OR_DEPARTMENT_OTHER)
Admission: RE | Disposition: A | Discharge status: Home / Self Care | Payer: Self-pay | Source: Home / Self Care | Attending: Orthopedic Surgery

## 2020-12-24 ENCOUNTER — Ambulatory Visit (HOSPITAL_BASED_OUTPATIENT_CLINIC_OR_DEPARTMENT_OTHER)
Admission: RE | Admit: 2020-12-24 | Discharge: 2020-12-24 | Disposition: A | Discharge status: Home / Self Care | Payer: No Typology Code available for payment source | Attending: Orthopedic Surgery | Admitting: Orthopedic Surgery

## 2020-12-24 DIAGNOSIS — G5621 Lesion of ulnar nerve, right upper limb: Secondary | ICD-10-CM | POA: Diagnosis not present

## 2020-12-24 DIAGNOSIS — Z7982 Long term (current) use of aspirin: Secondary | ICD-10-CM | POA: Diagnosis not present

## 2020-12-24 DIAGNOSIS — Z7984 Long term (current) use of oral hypoglycemic drugs: Secondary | ICD-10-CM | POA: Diagnosis not present

## 2020-12-24 DIAGNOSIS — G5601 Carpal tunnel syndrome, right upper limb: Secondary | ICD-10-CM | POA: Diagnosis not present

## 2020-12-24 DIAGNOSIS — Z886 Allergy status to analgesic agent status: Secondary | ICD-10-CM | POA: Insufficient documentation

## 2020-12-24 DIAGNOSIS — Z951 Presence of aortocoronary bypass graft: Secondary | ICD-10-CM | POA: Insufficient documentation

## 2020-12-24 DIAGNOSIS — Z888 Allergy status to other drugs, medicaments and biological substances status: Secondary | ICD-10-CM | POA: Diagnosis not present

## 2020-12-24 DIAGNOSIS — Z79899 Other long term (current) drug therapy: Secondary | ICD-10-CM | POA: Insufficient documentation

## 2020-12-24 HISTORY — PX: ULNAR NERVE TRANSPOSITION: SHX2595

## 2020-12-24 HISTORY — DX: Toxic effect of herbicides and fungicides, accidental (unintentional), initial encounter: T60.3X1A

## 2020-12-24 HISTORY — DX: Non-ST elevation (NSTEMI) myocardial infarction: I21.4

## 2020-12-24 HISTORY — PX: CARPAL TUNNEL RELEASE: SHX101

## 2020-12-24 LAB — GLUCOSE, CAPILLARY
Glucose-Capillary: 195 mg/dL — ABNORMAL HIGH (ref 70–99)
Glucose-Capillary: 170 mg/dL — ABNORMAL HIGH (ref 70–99)

## 2020-12-24 SURGERY — CARPAL TUNNEL RELEASE
Anesthesia: General | Site: Wrist | Laterality: Right

## 2020-12-24 MED ORDER — BUPIVACAINE HCL (PF) 0.25 % IJ SOLN
INTRAMUSCULAR | Status: DC | PRN
  Administered 2020-12-24: 10 mL

## 2020-12-24 MED ORDER — ONDANSETRON HCL 4 MG/2ML IJ SOLN: 4.0000 mg | Freq: Once | INTRAMUSCULAR | Status: DC | PRN

## 2020-12-24 MED ORDER — MEPERIDINE HCL 25 MG/ML IJ SOLN: 6.2500 mg | INTRAMUSCULAR | Status: DC | PRN

## 2020-12-24 MED ORDER — CEFAZOLIN SODIUM-DEXTROSE 2-4 GM/100ML-% IV SOLN
INTRAVENOUS | Status: AC
  Filled 2020-12-24: qty 100

## 2020-12-24 MED ORDER — PROPOFOL 10 MG/ML IV BOLUS
INTRAVENOUS | Status: AC
  Filled 2020-12-24: qty 40

## 2020-12-24 MED ORDER — ONDANSETRON HCL 4 MG/2ML IJ SOLN
INTRAMUSCULAR | Status: DC | PRN
  Administered 2020-12-24: 4 mg via INTRAVENOUS

## 2020-12-24 MED ORDER — CEFAZOLIN SODIUM-DEXTROSE 2-4 GM/100ML-% IV SOLN: 2.0000 g | INTRAVENOUS | Status: DC

## 2020-12-24 MED ORDER — LIDOCAINE 2% (20 MG/ML) 5 ML SYRINGE
INTRAMUSCULAR | Status: AC
  Filled 2020-12-24: qty 5

## 2020-12-24 MED ORDER — HYDROCODONE-ACETAMINOPHEN 5-325 MG PO TABS: ORAL_TABLET | ORAL | 0 refills | Status: AC

## 2020-12-24 MED ORDER — OXYCODONE HCL 5 MG PO TABS
5.0000 mg | ORAL_TABLET | Freq: Once | ORAL | Status: DC | PRN
Start: 2020-12-24 — End: 2020-12-24

## 2020-12-24 MED ORDER — PHENYLEPHRINE HCL (PRESSORS) 10 MG/ML IV SOLN
INTRAVENOUS | Status: DC | PRN
  Administered 2020-12-24 (×2): 200 ug via INTRAVENOUS

## 2020-12-24 MED ORDER — PROPOFOL 10 MG/ML IV BOLUS
INTRAVENOUS | Status: DC | PRN
  Administered 2020-12-24: 150 mg via INTRAVENOUS

## 2020-12-24 MED ORDER — LACTATED RINGERS IV SOLN: INTRAVENOUS | Status: DC

## 2020-12-24 MED ORDER — ONDANSETRON HCL 4 MG/2ML IJ SOLN
INTRAMUSCULAR | Status: AC
  Filled 2020-12-24: qty 2

## 2020-12-24 MED ORDER — EPHEDRINE SULFATE 50 MG/ML IJ SOLN
INTRAMUSCULAR | Status: DC | PRN
  Administered 2020-12-24: 5 mg via INTRAVENOUS
  Administered 2020-12-24: 10 mg via INTRAVENOUS
  Administered 2020-12-24: 15 mg via INTRAVENOUS
  Administered 2020-12-24: 5 mg via INTRAVENOUS

## 2020-12-24 MED ORDER — HYDROCODONE-ACETAMINOPHEN 5-325 MG PO TABS: ORAL_TABLET | ORAL | 0 refills | Status: DC

## 2020-12-24 MED ORDER — DEXAMETHASONE SODIUM PHOSPHATE 10 MG/ML IJ SOLN
INTRAMUSCULAR | Status: DC | PRN
  Administered 2020-12-24: 10 mg via INTRAVENOUS

## 2020-12-24 MED ORDER — OXYCODONE HCL 5 MG/5ML PO SOLN
5.0000 mg | Freq: Once | ORAL | Status: DC | PRN
Start: 2020-12-24 — End: 2020-12-24

## 2020-12-24 MED ORDER — ACETAMINOPHEN 160 MG/5ML PO SOLN: 325.0000 mg | ORAL | Status: DC | PRN

## 2020-12-24 MED ORDER — DEXAMETHASONE SODIUM PHOSPHATE 10 MG/ML IJ SOLN
INTRAMUSCULAR | Status: AC
  Filled 2020-12-24: qty 1

## 2020-12-24 MED ORDER — LIDOCAINE HCL (CARDIAC) PF 100 MG/5ML IV SOSY
PREFILLED_SYRINGE | INTRAVENOUS | Status: DC | PRN
  Administered 2020-12-24: 100 mg via INTRAVENOUS

## 2020-12-24 MED ORDER — LACTATED RINGERS IV SOLN: INTRAVENOUS | Status: DC | PRN

## 2020-12-24 MED ORDER — FENTANYL CITRATE (PF) 100 MCG/2ML IJ SOLN
INTRAMUSCULAR | Status: AC
  Filled 2020-12-24: qty 2

## 2020-12-24 MED ORDER — FENTANYL CITRATE (PF) 100 MCG/2ML IJ SOLN: 25.0000 ug | INTRAMUSCULAR | Status: DC | PRN

## 2020-12-24 MED ORDER — FENTANYL CITRATE (PF) 100 MCG/2ML IJ SOLN
INTRAMUSCULAR | Status: DC | PRN
  Administered 2020-12-24: 100 ug via INTRAVENOUS

## 2020-12-24 MED ORDER — ACETAMINOPHEN 325 MG PO TABS: 325.0000 mg | ORAL_TABLET | ORAL | Status: DC | PRN

## 2020-12-24 MED ORDER — CEFAZOLIN SODIUM-DEXTROSE 2-3 GM-%(50ML) IV SOLR
INTRAVENOUS | Status: DC | PRN
  Administered 2020-12-24: 2 g via INTRAVENOUS

## 2020-12-24 SURGICAL SUPPLY — 55 items
APL PRP STRL LF DISP 70% ISPRP (MISCELLANEOUS) ×2
BLADE MINI RND TIP GREEN BEAV (BLADE) IMPLANT
BLADE SURG 15 STRL LF DISP TIS (BLADE) ×4 IMPLANT
BLADE SURG 15 STRL SS (BLADE) ×6
BNDG CMPR 9X4 STRL LF SNTH (GAUZE/BANDAGES/DRESSINGS) ×2
BNDG ELASTIC 3X5.8 VLCR STR LF (GAUZE/BANDAGES/DRESSINGS) ×6 IMPLANT
BNDG ELASTIC 4X5.8 VLCR STR LF (GAUZE/BANDAGES/DRESSINGS) IMPLANT
BNDG ESMARK 4X9 LF (GAUZE/BANDAGES/DRESSINGS) ×3 IMPLANT
BNDG GAUZE ELAST 4 BULKY (GAUZE/BANDAGES/DRESSINGS) ×3 IMPLANT
CHLORAPREP W/TINT 26 (MISCELLANEOUS) ×3 IMPLANT
CORD BIPOLAR FORCEPS 12FT (ELECTRODE) ×3 IMPLANT
COVER BACK TABLE 60X90IN (DRAPES) ×3 IMPLANT
COVER MAYO STAND STRL (DRAPES) ×3 IMPLANT
COVER WAND RF STERILE (DRAPES) IMPLANT
CUFF TOURN SGL QUICK 18X3 (MISCELLANEOUS) ×3 IMPLANT
CUFF TOURN SGL QUICK 18X4 (TOURNIQUET CUFF) ×3 IMPLANT
DECANTER SPIKE VIAL GLASS SM (MISCELLANEOUS) IMPLANT
DRAPE EXTREMITY T 121X128X90 (DISPOSABLE) ×3 IMPLANT
DRAPE SURG 17X23 STRL (DRAPES) ×3 IMPLANT
DRSG PAD ABDOMINAL 8X10 ST (GAUZE/BANDAGES/DRESSINGS) ×6 IMPLANT
GAUZE 4X4 16PLY RFD (DISPOSABLE) IMPLANT
GAUZE SPONGE 4X4 12PLY STRL (GAUZE/BANDAGES/DRESSINGS) ×3 IMPLANT
GAUZE XEROFORM 1X8 LF (GAUZE/BANDAGES/DRESSINGS) ×3 IMPLANT
GLOVE SRG 8 PF TXTR STRL LF DI (GLOVE) ×2 IMPLANT
GLOVE SURG ENC MOIS LTX SZ7.5 (GLOVE) ×3 IMPLANT
GLOVE SURG ORTHO LTX SZ8 (GLOVE) ×3 IMPLANT
GLOVE SURG UNDER POLY LF SZ8 (GLOVE) ×3
GLOVE SURG UNDER POLY LF SZ8.5 (GLOVE) ×3 IMPLANT
GOWN STRL REUS W/ TWL LRG LVL3 (GOWN DISPOSABLE) ×4 IMPLANT
GOWN STRL REUS W/TWL LRG LVL3 (GOWN DISPOSABLE) ×6
GOWN STRL REUS W/TWL XL LVL3 (GOWN DISPOSABLE) ×6 IMPLANT
NEEDLE HYPO 25X1 1.5 SAFETY (NEEDLE) ×3 IMPLANT
NS IRRIG 1000ML POUR BTL (IV SOLUTION) ×3 IMPLANT
PACK BASIN DAY SURGERY FS (CUSTOM PROCEDURE TRAY) ×3 IMPLANT
PAD CAST 3X4 CTTN HI CHSV (CAST SUPPLIES) ×2 IMPLANT
PAD CAST 4YDX4 CTTN HI CHSV (CAST SUPPLIES) ×2 IMPLANT
PADDING CAST ABS 4INX4YD NS (CAST SUPPLIES)
PADDING CAST ABS COTTON 4X4 ST (CAST SUPPLIES) IMPLANT
PADDING CAST COTTON 3X4 STRL (CAST SUPPLIES) ×3
PADDING CAST COTTON 4X4 STRL (CAST SUPPLIES) ×3
SLEEVE SCD COMPRESS KNEE MED (STOCKING) ×3 IMPLANT
SLING ARM FOAM STRAP LRG (SOFTGOODS) ×3 IMPLANT
SPLINT FAST PLASTER 5X30 (CAST SUPPLIES)
SPLINT PLASTER CAST FAST 5X30 (CAST SUPPLIES) IMPLANT
SPLINT PLASTER CAST XFAST 3X15 (CAST SUPPLIES) IMPLANT
SPLINT PLASTER XTRA FASTSET 3X (CAST SUPPLIES)
STOCKINETTE 4X48 STRL (DRAPES) ×3 IMPLANT
SUT ETHILON 4 0 PS 2 18 (SUTURE) ×6 IMPLANT
SUT VIC AB 2-0 SH 27 (SUTURE) ×3
SUT VIC AB 2-0 SH 27XBRD (SUTURE) ×2 IMPLANT
SUT VICRYL 4-0 PS2 18IN ABS (SUTURE) ×3 IMPLANT
SYR BULB EAR ULCER 3OZ GRN STR (SYRINGE) ×3 IMPLANT
SYR CONTROL 10ML LL (SYRINGE) ×3 IMPLANT
TOWEL GREEN STERILE FF (TOWEL DISPOSABLE) ×6 IMPLANT
UNDERPAD 30X36 HEAVY ABSORB (UNDERPADS AND DIAPERS) ×3 IMPLANT

## 2020-12-24 NOTE — Anesthesia Postprocedure Evaluation (Signed)
Anesthesia Post Note  Patient: Paul Levine  Procedure(s) Performed: RIGHT CARPAL TUNNEL RELEASE (Right Wrist) RIGHT ULNAR NERVE DECOMPRESSION (Right Elbow)     Patient location during evaluation: PACU Anesthesia Type: General Level of consciousness: awake and alert Pain management: pain level controlled Vital Signs Assessment: post-procedure vital signs reviewed and stable Respiratory status: spontaneous breathing, nonlabored ventilation, respiratory function stable and patient connected to nasal cannula oxygen Cardiovascular status: blood pressure returned to baseline and stable Postop Assessment: no apparent nausea or vomiting Anesthetic complications: no   No complications documented.  Last Vitals:  Vitals:   12/24/20 1014 12/24/20 1030  BP: (!) 159/84 (!) 178/90  Pulse: 75 75  Resp: 15 17  Temp:  36.6 C  SpO2: 95% 96%    Last Pain:  Vitals:   12/24/20 1030  TempSrc:   PainSc: 0-No pain                 Kellee Sittner

## 2020-12-24 NOTE — Op Note (Signed)
I assisted Surgeon(s) and Role:    * Betha Loa, MD - Primary    Cindee Salt, MD - Assisting on the Procedure(s): RIGHT CARPAL TUNNEL RELEASE RIGHT ULNAR NERVE DECOMPRESSION on 12/24/2020.  I provided assistance on this case as follows: setup, approach the median nerve with release, approach to ulnar nerve at the elbow, retraction, identification and release of the ulnar nerve, closuer of the wounds and application of the dressings.  Electronically signed by: Cindee Salt, MD Date: 12/24/2020 Time: 9:56 AM

## 2020-12-24 NOTE — H&P (Signed)
Paul Levine is an 75 y.o. male.   Chief Complaint: right hand numbness HPI: 75 yo male with numbness and tingling right hand.  Positive nerve conduction studies. Atrophy developing in hand.  He wishes to proceed with right carpal tunnel release and ulnar nerve decompression at the elbow.  Allergies:  Allergies  Allergen Reactions  . Metformin And Related Other (See Comments)    GI upset    Past Medical History:  Diagnosis Date  . Agent Orange poisoning   . CAD (coronary artery disease)    a. s/p CABG x3 (LIMA->LAD, Y radial graft off of LIMA to OM3 and RPL1) ~2003;  b. 01/2015 STEMI (aVR/aVL)/PCI: LM 95/LCX 100p (3.5x15 Resolute DES covering LM/LCX), LAD 95ost/p (PTCA), 136m, 90d after LIMA insertion (2.25x32 Synergy DES), RCA 95/74m, 100d, LIMA->LAD patent, free radial comes off of LIMA and plugs into OM3 (ok) and then RPL1 (ok). 50% stenosis in graft between touchdowns. Nl EF.  [name] Depression   . Diabetes mellitus without complication (HCC)   . Essential hypertension   . History of kidney stones   . Hyperlipidemia   . Impingement of ulnar nerve, right   . Ischemic cardiomyopathy    a.  Echo 02/22/15:   EF 35-40%, mod diff HK, WMA cannot be excluded, mod LAE, mild RVE, mild to mod RAE  . NSTEMI (non-ST elevated myocardial infarction) (HCC)    2016  . Sleep apnea   . TIA (transient ischemic attack)     Past Surgical History:  Procedure Laterality Date  . CARDIAC CATHETERIZATION N/A 02/20/2015   Procedure: Left Heart Cath and Coronary Angiography;  Surgeon: Lennette Bihari, MD;  Location: [facility] INVASIVE CV LAB;  Service: Cardiovascular;  Laterality: N/A;  . CARDIAC CATHETERIZATION N/A 02/20/2015   Procedure: Coronary Stent Intervention;  Surgeon: Lennette Bihari, MD;  Location: [facility] INVASIVE CV LAB;  Service: Cardiovascular;  Laterality: N/A;  . CORONARY ARTERY BYPASS GRAFT    . CYSTOSCOPY W/ URETERAL STENT PLACEMENT Right 02/04/2020   Procedure: CYSTOSCOPY WITH RETROGRADE PYELOGRAM/URETERAL  STENT PLACEMENT;  Surgeon: Crist Fat, MD;  Location: [facility];  Service: Urology;  Laterality: Right;  . NEPHROLITHOTOMY Right 03/12/2020   Procedure: RIGHT NEPHROLITHOTOMY PERCUTANEOUS WITH SURGEON ACCESS;  Surgeon: Crist Fat, MD;  Location: [facility] ORS;  Service: Urology;  Laterality: Right;    Family History: Family History  Problem Relation Age of Onset  . Congestive Heart Failure Mother   . Heart failure Mother   . Hypertension Mother   . Hypertension Sister   . Heart attack Neg Hx   . Stroke Neg Hx     Social History:   reports that he has never smoked. He has never used smokeless tobacco. He reports current alcohol use. He reports that he does not use drugs.  Medications: Medications Prior to Admission  Medication Sig Dispense Refill  . Apoaequorin (PREVAGEN EXTRA STRENGTH) 20 MG CAPS Take 20 mg by mouth daily.    [name] aspirin 81 MG tablet Take 81 mg by mouth daily.    . clopidogrel (PLAVIX) 75 MG tablet Take 1 tablet (75 mg total) by mouth daily. Restart on 8/16    . Coenzyme Q10 200 MG capsule Take 200 mg by mouth daily.    [name] glipiZIDE (GLUCOTROL) 5 MG tablet Take 5 mg by mouth 2 (two) times daily before a meal.     . glucosamine-chondroitin 500-400 MG tablet Take 1 tablet by mouth daily.    [name] losartan (COZAAR) 100 MG tablet  Take 100 mg by mouth daily.    . Melatonin 1 MG CAPS Take 1 mg by mouth at bedtime.    . metoprolol (LOPRESSOR) 50 MG tablet Take 25 mg by mouth 2 (two) times daily.    . Multiple Vitamins-Minerals (MULTIVITAMIN GUMMIES ADULT PO) Take 4 tablets by mouth daily.     . rosuvastatin (CRESTOR) 20 MG tablet Take 10 mg by mouth daily.    . vitamin B-12 (CYANOCOBALAMIN) 1000 MCG tablet Take 1,000 mcg by mouth daily.    . ciprofloxacin (CIPRO) 500 MG tablet Take 1 tablet (500 mg total) by mouth 2 (two) times daily. 10 tablet 0  . nitroGLYCERIN (NITROSTAT) 0.4 MG SL tablet Place 1 tablet (0.4 mg total) under the tongue every 5 (five) minutes as needed  for chest pain. 25 tablet 3  . traMADol (ULTRAM) 50 MG tablet Take 1-2 tablets (50-100 mg total) by mouth every 6 (six) hours as needed for moderate pain. 15 tablet 0  . vitamin C (ASCORBIC ACID) 500 MG tablet Take 500 mg by mouth daily.      Results for orders placed or performed during the hospital encounter of 12/24/20 (from the past 48 hour(s))  Glucose, capillary     Status: Abnormal   Collection Time: 12/24/20  7:19 AM  Result Value Ref Range   Glucose-Capillary 195 (H) 70 - 99 mg/dL    Comment: Glucose reference range applies only to samples taken after fasting for at least 8 hours.   Comment 1 Notify RN    Comment 2 Document in Chart     No results found.   A comprehensive review of systems was negative.  Blood pressure (!) 165/72, pulse 67, temperature 98.2 F (36.8 C), temperature source Oral, resp. rate 18, height 5\' 6"  (1.676 m), weight 81.4 kg, SpO2 100 %.  General appearance: alert, cooperative and appears stated age Head: Normocephalic, without obvious abnormality, atraumatic Neck: supple, symmetrical, trachea midline Cardio: regular rate and rhythm Resp: clear to auscultation bilaterally Extremities: Intact sensation and capillary refill all digits though it feels different in the ring and small fingers.  +epl/fpl/io.  No wounds.  Pulses: 2+ and symmetric Skin: Skin color, texture, turgor normal. No rashes or lesions Neurologic: Grossly normal Incision/Wound: none  Assessment/Plan Right carpal tunnel syndrome and ulnar nerve compression at the elbow.  Non operative and operative treatment options have been discussed with the patient and patient wishes to proceed with operative treatment. Risks, benefits, and alternatives of surgery have been discussed and the patient agrees with the plan of care.   12/24/2020, 8:33 AM

## 2020-12-24 NOTE — Op Note (Signed)
12/24/2020 [facility]                              OPERATIVE REPORT   PREOPERATIVE DIAGNOSIS:   1.  Right carpal tunnel syndrome 2.  Right ulnar nerve compression at the elbow  POSTOPERATIVE DIAGNOSIS:   1.  Right carpal tunnel syndrome 2.  Right ulnar nerve compression at the elbow  PROCEDURE:   1.  Right carpal tunnel release 2.  Right ulnar nerve decompression at the elbow  SURGEON:  Betha Loa, MD  ASSISTANT:  Cindee Salt, MD  ANESTHESIA: General  IV FLUIDS:  Per anesthesia flow sheet.  ESTIMATED BLOOD LOSS:  Minimal.  COMPLICATIONS:  None.  SPECIMENS:  None.  TOURNIQUET TIME:    Total Tourniquet Time Documented: Upper Arm (Right) - 36 minutes Total: Upper Arm (Right) - 36 minutes   DISPOSITION:  Stable to PACU.  LOCATION: [facility]  INDICATIONS: 75 year old male with numbness and tingling in the right hand.  Positive nerve conduction studies.  He is noted atrophy in the hand. He wishes to have a carpal tunnel release and ulnar nerve decompression at the elbow with possible transposition for management of his symptoms.  Risks, benefits and alternatives of surgery were discussed including the risk of blood loss; infection; damage to nerves, vessels, tendons, ligaments, bone; failure of surgery; need for additional surgery; complications with wound healing; continued pain; recurrence of carpal tunnel syndrome; and damage to motor branch. He voiced understanding of these risks and elected to proceed.   OPERATIVE COURSE:  After being identified preoperatively by myself, the patient and I agreed upon the procedure and site of procedure.  The surgical site was marked.  Surgical consent had been signed.  He was given IV Ancef as preoperative antibiotic prophylaxis.  He was transferred to the operating room and placed on the operating room table in supine position with the Right upper extremity on an armboard.  General anesthesia was induced by  the anesthesiologist.  Right upper extremity was prepped and draped in normal sterile orthopaedic fashion.  A surgical pause was performed between the surgeons, anesthesia, and operating room staff, and all were in agreement as to the patient, procedure, and site of procedure.  Tourniquet at the proximal aspect of the extremity was inflated to 250 mmHg after exsanguination of the arm with an Esmarch bandage  Incision was made over the transverse carpal ligament and carried into the subcutaneous tissues by spreading technique.  Bipolar electrocautery was used to obtain hemostasis.  The palmar fascia was sharply incised.  The transverse carpal ligament was identified and sharply incised.  It was incised distally first.  The flexor tendons were identified.  The flexor tendon to the ring finger was identified and retracted radially.  The transverse carpal ligament was then incised proximally.  Scissors were used to split the distal aspect of the volar antebrachial fascia.  A finger was placed into the wound to ensure complete decompression, which was the case.  The nerve was examined.  It was flattened and hyperemic.  The motor branch was identified and was intact.  The wound was copiously irrigated with sterile saline.  It was then closed with 4-0 nylon in a horizontal mattress fashion.  An incision was then made at the medial side of the elbow.  This is carried in subcutaneous tissues by spreading technique.  Bipolar electrocautery was used to obtain hemostasis.  Care was  taken to protect cutaneous branches of nerve.  Ulnar nerve was identified proximal to Osborne's ligament.  It was carefully freed up.  Osborne's ligament was sharply incised from proximal to distal while protecting the nerve.  The fascia over the FCU muscle was released and the FCU heads spread.  The investing fascia over the nerve was then released protecting the nerve with a KMI guide.  Good decompression was obtained.  The nerve was then  decompressed proximally.  Again the fascia over the muscle was released.  The investing fascia over the nerve was released after protecting the nerve with a KMI guide.  Good decompression was obtained.  The elbow was flexed.  The nerve did not subluxate out of the groove.  The wound was copiously irrigated with sterile saline.  The volar leaflet of Osborne's ligament was repaired to the posterior subcutaneous tissues to provide a bolster.  There was no compression over the nerve.  Inverted interrupted 4-0 Vicryl sutures were then placed in the subcutaneous tissues and skin was closed with 4-0 nylon in a horizontal mattress fashion.  Both wounds were injected with quarter percent plain Marcaine to aid in postoperative analgesia.  They were dressed with sterile Xeroform, 4x4s, an ABD, and wrapped with Kerlix and an Ace bandage.  Tourniquet was deflated at 36 minutes.  Fingertips were pink with brisk capillary refill after deflation of the tourniquet.  Operative drapes were broken down.  The patient was awoken from anesthesia safely.  He was transferred back to stretcher and taken to the PACU in stable condition.  I will see him back in the office in 1 week for postoperative followup.  I will give him a prescription for Norco 5/325 1-2 tabs PO q6 hours prn pain, dispense # 20.    Betha Loa, MD Electronically signed, 12/24/20

## 2020-12-24 NOTE — Anesthesia Procedure Notes (Signed)
Procedure Name: LMA Insertion Performed by: Phylicia Mcgaugh M, CRNA Pre-anesthesia Checklist: Patient identified, Emergency Drugs available, Suction available and Patient being monitored Patient Re-evaluated:Patient Re-evaluated prior to induction Oxygen Delivery Method: Circle system utilized Preoxygenation: Pre-oxygenation with 100% oxygen Induction Type: IV induction Ventilation: Mask ventilation without difficulty LMA: LMA inserted LMA Size: 4.0 Number of attempts: 1 Airway Equipment and Method: Bite block Placement Confirmation: positive ETCO2,  CO2 detector and breath sounds checked- equal and bilateral Tube secured with: Tape Dental Injury: Teeth and Oropharynx as per pre-operative assessment        

## 2020-12-24 NOTE — Discharge Instructions (Addendum)

## 2020-12-24 NOTE — Transfer of Care (Signed)
Immediate Anesthesia Transfer of Care Note  Patient: Paul Levine  Procedure(s) Performed: RIGHT CARPAL TUNNEL RELEASE (Right Wrist) RIGHT ULNAR NERVE DECOMPRESSION (Right Elbow)  Patient Location: PACU  Anesthesia Type:General  Level of Consciousness: awake, alert  and oriented  Airway & Oxygen Therapy: Patient Spontanous Breathing and Patient connected to face mask oxygen  Post-op Assessment: Report given to RN and Post -op Vital signs reviewed and stable  Post vital signs: Reviewed and stable  Last Vitals:  Vitals Value Taken Time  BP 176/95 12/24/20 0954  Temp    Pulse 76 12/24/20 0955  Resp 14 12/24/20 0955  SpO2 97 % 12/24/20 0955  Vitals shown include unvalidated device data.  Last Pain:  Vitals:   12/24/20 0708  TempSrc: Oral  PainSc: 0-No pain      Patients Stated Pain Goal: 3 (12/24/20 0708)  Complications: No complications documented.

## 2020-12-25 ENCOUNTER — Encounter (HOSPITAL_BASED_OUTPATIENT_CLINIC_OR_DEPARTMENT_OTHER): Payer: Self-pay | Admitting: Orthopedic Surgery

## 2021-03-30 ENCOUNTER — Other Ambulatory Visit: Payer: Self-pay | Admitting: Orthopedic Surgery

## 2021-04-29 ENCOUNTER — Other Ambulatory Visit: Payer: Self-pay

## 2021-04-29 ENCOUNTER — Encounter (HOSPITAL_BASED_OUTPATIENT_CLINIC_OR_DEPARTMENT_OTHER): Payer: Self-pay | Admitting: Orthopedic Surgery

## 2021-04-30 ENCOUNTER — Encounter (HOSPITAL_BASED_OUTPATIENT_CLINIC_OR_DEPARTMENT_OTHER)
Admission: RE | Admit: 2021-04-30 | Discharge: 2021-04-30 | Disposition: A | Discharge status: Home / Self Care | Payer: Medicare Other | Source: Ambulatory Visit | Attending: Orthopedic Surgery | Admitting: Orthopedic Surgery

## 2021-04-30 DIAGNOSIS — Z01812 Encounter for preprocedural laboratory examination: Secondary | ICD-10-CM | POA: Insufficient documentation

## 2021-04-30 LAB — BASIC METABOLIC PANEL
Anion gap: 8 (ref 5–15)
BUN: 38 mg/dL — ABNORMAL HIGH (ref 8–23)
CO2: 25 mmol/L (ref 22–32)
Calcium: 9.6 mg/dL (ref 8.9–10.3)
Chloride: 106 mmol/L (ref 98–111)
Creatinine, Ser: 1.31 mg/dL — ABNORMAL HIGH (ref 0.61–1.24)
GFR, Estimated: 57 mL/min — ABNORMAL LOW (ref >60)
Glucose, Bld: 85 mg/dL (ref 70–99)
Potassium: 5.8 mmol/L — ABNORMAL HIGH (ref 3.5–5.1)
Sodium: 139 mmol/L (ref 135–145)

## 2021-05-03 NOTE — Progress Notes (Signed)
K+5.8, Dr. Hyacinth Meeker aware, will repeat Day of surgery.

## 2021-05-06 ENCOUNTER — Ambulatory Visit (HOSPITAL_BASED_OUTPATIENT_CLINIC_OR_DEPARTMENT_OTHER): Payer: No Typology Code available for payment source | Admitting: Anesthesiology

## 2021-05-06 ENCOUNTER — Ambulatory Visit (HOSPITAL_BASED_OUTPATIENT_CLINIC_OR_DEPARTMENT_OTHER)
Admission: RE | Admit: 2021-05-06 | Discharge: 2021-05-06 | Disposition: A | Discharge status: Home / Self Care | Payer: No Typology Code available for payment source | Attending: Orthopedic Surgery | Admitting: Orthopedic Surgery

## 2021-05-06 ENCOUNTER — Other Ambulatory Visit: Payer: Self-pay

## 2021-05-06 ENCOUNTER — Encounter (HOSPITAL_BASED_OUTPATIENT_CLINIC_OR_DEPARTMENT_OTHER)
Admission: RE | Disposition: A | Discharge status: Home / Self Care | Payer: Self-pay | Source: Home / Self Care | Attending: Orthopedic Surgery

## 2021-05-06 ENCOUNTER — Encounter (HOSPITAL_BASED_OUTPATIENT_CLINIC_OR_DEPARTMENT_OTHER): Payer: Self-pay | Admitting: Orthopedic Surgery

## 2021-05-06 DIAGNOSIS — G5602 Carpal tunnel syndrome, left upper limb: Secondary | ICD-10-CM | POA: Diagnosis not present

## 2021-05-06 DIAGNOSIS — E119 Type 2 diabetes mellitus without complications: Secondary | ICD-10-CM | POA: Diagnosis not present

## 2021-05-06 DIAGNOSIS — Z7984 Long term (current) use of oral hypoglycemic drugs: Secondary | ICD-10-CM | POA: Insufficient documentation

## 2021-05-06 DIAGNOSIS — Z888 Allergy status to other drugs, medicaments and biological substances status: Secondary | ICD-10-CM | POA: Diagnosis not present

## 2021-05-06 DIAGNOSIS — G5622 Lesion of ulnar nerve, left upper limb: Secondary | ICD-10-CM | POA: Diagnosis not present

## 2021-05-06 DIAGNOSIS — Z79899 Other long term (current) drug therapy: Secondary | ICD-10-CM | POA: Diagnosis not present

## 2021-05-06 DIAGNOSIS — Z7982 Long term (current) use of aspirin: Secondary | ICD-10-CM | POA: Insufficient documentation

## 2021-05-06 HISTORY — PX: CARPAL TUNNEL RELEASE: SHX101

## 2021-05-06 HISTORY — PX: ULNAR NERVE TRANSPOSITION: SHX2595

## 2021-05-06 LAB — GLUCOSE, CAPILLARY
Glucose-Capillary: 143 mg/dL — ABNORMAL HIGH (ref 70–99)
Glucose-Capillary: 127 mg/dL — ABNORMAL HIGH (ref 70–99)

## 2021-05-06 LAB — BASIC METABOLIC PANEL
Anion gap: 11 (ref 5–15)
BUN: 33 mg/dL — ABNORMAL HIGH (ref 8–23)
CO2: 23 mmol/L (ref 22–32)
Calcium: 9.4 mg/dL (ref 8.9–10.3)
Chloride: 103 mmol/L (ref 98–111)
Creatinine, Ser: 1.37 mg/dL — ABNORMAL HIGH (ref 0.61–1.24)
GFR, Estimated: 54 mL/min — ABNORMAL LOW (ref >60)
Glucose, Bld: 155 mg/dL — ABNORMAL HIGH (ref 70–99)
Potassium: 4.5 mmol/L (ref 3.5–5.1)
Sodium: 137 mmol/L (ref 135–145)

## 2021-05-06 SURGERY — CARPAL TUNNEL RELEASE
Anesthesia: General | Site: Wrist | Laterality: Left

## 2021-05-06 MED ORDER — ONDANSETRON HCL 4 MG/2ML IJ SOLN
INTRAMUSCULAR | Status: AC
  Filled 2021-05-06: qty 2

## 2021-05-06 MED ORDER — ACETAMINOPHEN 500 MG PO TABS
1000.0000 mg | ORAL_TABLET | Freq: Once | ORAL | Status: AC
  Administered 2021-05-06: 1000 mg via ORAL

## 2021-05-06 MED ORDER — BUPIVACAINE HCL (PF) 0.25 % IJ SOLN
INTRAMUSCULAR | Status: DC | PRN
  Administered 2021-05-06: 10 mL

## 2021-05-06 MED ORDER — ONDANSETRON HCL 4 MG/2ML IJ SOLN: 4.0000 mg | Freq: Once | INTRAMUSCULAR | Status: DC | PRN

## 2021-05-06 MED ORDER — FENTANYL CITRATE (PF) 100 MCG/2ML IJ SOLN
INTRAMUSCULAR | Status: AC
  Filled 2021-05-06: qty 2

## 2021-05-06 MED ORDER — ONDANSETRON HCL 4 MG/2ML IJ SOLN
INTRAMUSCULAR | Status: DC | PRN
  Administered 2021-05-06: 4 mg via INTRAVENOUS

## 2021-05-06 MED ORDER — EPHEDRINE 5 MG/ML INJ
INTRAVENOUS | Status: AC
  Filled 2021-05-06: qty 20

## 2021-05-06 MED ORDER — EPHEDRINE SULFATE 50 MG/ML IJ SOLN
INTRAMUSCULAR | Status: DC | PRN
  Administered 2021-05-06 (×2): 5 mg via INTRAVENOUS
  Administered 2021-05-06: 10 mg via INTRAVENOUS
  Administered 2021-05-06 (×2): 5 mg via INTRAVENOUS

## 2021-05-06 MED ORDER — DEXAMETHASONE SODIUM PHOSPHATE 10 MG/ML IJ SOLN
INTRAMUSCULAR | Status: DC | PRN
  Administered 2021-05-06: 4 mg via INTRAVENOUS

## 2021-05-06 MED ORDER — LIDOCAINE 2% (20 MG/ML) 5 ML SYRINGE
INTRAMUSCULAR | Status: AC
  Filled 2021-05-06: qty 5

## 2021-05-06 MED ORDER — PROPOFOL 10 MG/ML IV BOLUS
INTRAVENOUS | Status: AC
  Filled 2021-05-06: qty 20

## 2021-05-06 MED ORDER — CEFAZOLIN SODIUM-DEXTROSE 2-4 GM/100ML-% IV SOLN
INTRAVENOUS | Status: AC
  Filled 2021-05-06: qty 100

## 2021-05-06 MED ORDER — LACTATED RINGERS IV SOLN: INTRAVENOUS | Status: DC

## 2021-05-06 MED ORDER — ACETAMINOPHEN 500 MG PO TABS
ORAL_TABLET | ORAL | Status: AC
  Filled 2021-05-06: qty 2

## 2021-05-06 MED ORDER — FENTANYL CITRATE (PF) 100 MCG/2ML IJ SOLN
INTRAMUSCULAR | Status: DC | PRN
  Administered 2021-05-06: 25 ug via INTRAVENOUS
  Administered 2021-05-06: 50 ug via INTRAVENOUS
  Administered 2021-05-06: 25 ug via INTRAVENOUS

## 2021-05-06 MED ORDER — LIDOCAINE 2% (20 MG/ML) 5 ML SYRINGE
INTRAMUSCULAR | Status: DC | PRN
Start: 2021-05-06 — End: 2021-05-06
  Administered 2021-05-06: 80 mg via INTRAVENOUS

## 2021-05-06 MED ORDER — FENTANYL CITRATE (PF) 100 MCG/2ML IJ SOLN: 25.0000 ug | INTRAMUSCULAR | Status: DC | PRN

## 2021-05-06 MED ORDER — DEXAMETHASONE SODIUM PHOSPHATE 10 MG/ML IJ SOLN
INTRAMUSCULAR | Status: AC
  Filled 2021-05-06: qty 1

## 2021-05-06 MED ORDER — PROPOFOL 10 MG/ML IV BOLUS
INTRAVENOUS | Status: DC | PRN
  Administered 2021-05-06: 140 mg via INTRAVENOUS

## 2021-05-06 MED ORDER — CEFAZOLIN SODIUM-DEXTROSE 2-4 GM/100ML-% IV SOLN
2.0000 g | INTRAVENOUS | Status: AC
  Administered 2021-05-06: 2 g via INTRAVENOUS

## 2021-05-06 MED ORDER — 0.9 % SODIUM CHLORIDE (POUR BTL) OPTIME
TOPICAL | Status: DC | PRN
  Administered 2021-05-06: 200 mL

## 2021-05-06 SURGICAL SUPPLY — 53 items
APL PRP STRL LF DISP 70% ISPRP (MISCELLANEOUS) ×2
BLADE MINI RND TIP GREEN BEAV (BLADE) IMPLANT
BLADE SURG 15 STRL LF DISP TIS (BLADE) ×4 IMPLANT
BLADE SURG 15 STRL SS (BLADE) ×8
BNDG CMPR 9X4 STRL LF SNTH (GAUZE/BANDAGES/DRESSINGS) ×2
BNDG ELASTIC 3X5.8 VLCR STR LF (GAUZE/BANDAGES/DRESSINGS) ×8 IMPLANT
BNDG ELASTIC 4X5.8 VLCR STR LF (GAUZE/BANDAGES/DRESSINGS) ×4 IMPLANT
BNDG ESMARK 4X9 LF (GAUZE/BANDAGES/DRESSINGS) ×4 IMPLANT
BNDG GAUZE ELAST 4 BULKY (GAUZE/BANDAGES/DRESSINGS) ×4 IMPLANT
CHLORAPREP W/TINT 26 (MISCELLANEOUS) ×4 IMPLANT
CORD BIPOLAR FORCEPS 12FT (ELECTRODE) ×4 IMPLANT
COVER BACK TABLE 60X90IN (DRAPES) ×4 IMPLANT
COVER MAYO STAND STRL (DRAPES) ×4 IMPLANT
CUFF TOURN SGL QUICK 18X3 (MISCELLANEOUS) ×4 IMPLANT
CUFF TOURN SGL QUICK 18X4 (TOURNIQUET CUFF) ×4 IMPLANT
DECANTER SPIKE VIAL GLASS SM (MISCELLANEOUS) IMPLANT
DRAPE EXTREMITY T 121X128X90 (DISPOSABLE) ×4 IMPLANT
DRAPE SURG 17X23 STRL (DRAPES) ×4 IMPLANT
DRSG PAD ABDOMINAL 8X10 ST (GAUZE/BANDAGES/DRESSINGS) ×4 IMPLANT
GAUZE 4X4 16PLY ~~LOC~~+RFID DBL (SPONGE) IMPLANT
GAUZE SPONGE 4X4 12PLY STRL (GAUZE/BANDAGES/DRESSINGS) ×4 IMPLANT
GAUZE XEROFORM 1X8 LF (GAUZE/BANDAGES/DRESSINGS) ×4 IMPLANT
GLOVE SRG 8 PF TXTR STRL LF DI (GLOVE) ×2 IMPLANT
GLOVE SURG ENC MOIS LTX SZ7.5 (GLOVE) ×4 IMPLANT
GLOVE SURG ORTHO LTX SZ8 (GLOVE) ×4 IMPLANT
GLOVE SURG UNDER POLY LF SZ8 (GLOVE) ×4
GLOVE SURG UNDER POLY LF SZ8.5 (GLOVE) ×4 IMPLANT
GOWN STRL REUS W/ TWL LRG LVL3 (GOWN DISPOSABLE) IMPLANT
GOWN STRL REUS W/TWL LRG LVL3 (GOWN DISPOSABLE)
GOWN STRL REUS W/TWL XL LVL3 (GOWN DISPOSABLE) ×12 IMPLANT
NEEDLE HYPO 25X1 1.5 SAFETY (NEEDLE) ×4 IMPLANT
NS IRRIG 1000ML POUR BTL (IV SOLUTION) ×4 IMPLANT
PACK BASIN DAY SURGERY FS (CUSTOM PROCEDURE TRAY) ×4 IMPLANT
PAD CAST 3X4 CTTN HI CHSV (CAST SUPPLIES) ×2 IMPLANT
PAD CAST 4YDX4 CTTN HI CHSV (CAST SUPPLIES) ×2 IMPLANT
PADDING CAST ABS 4INX4YD NS (CAST SUPPLIES) ×2
PADDING CAST ABS COTTON 4X4 ST (CAST SUPPLIES) ×2 IMPLANT
PADDING CAST COTTON 3X4 STRL (CAST SUPPLIES) ×4
PADDING CAST COTTON 4X4 STRL (CAST SUPPLIES) ×4
SLEEVE SCD COMPRESS KNEE MED (STOCKING) ×4 IMPLANT
SPLINT FAST PLASTER 5X30 (CAST SUPPLIES)
SPLINT PLASTER CAST FAST 5X30 (CAST SUPPLIES) IMPLANT
SPLINT PLASTER CAST XFAST 3X15 (CAST SUPPLIES) IMPLANT
SPLINT PLASTER XTRA FASTSET 3X (CAST SUPPLIES)
STOCKINETTE 4X48 STRL (DRAPES) ×4 IMPLANT
SUT ETHILON 4 0 PS 2 18 (SUTURE) ×4 IMPLANT
SUT VIC AB 2-0 SH 27 (SUTURE) ×4
SUT VIC AB 2-0 SH 27XBRD (SUTURE) ×2 IMPLANT
SUT VICRYL 4-0 PS2 18IN ABS (SUTURE) ×4 IMPLANT
SYR BULB EAR ULCER 3OZ GRN STR (SYRINGE) ×4 IMPLANT
SYR CONTROL 10ML LL (SYRINGE) ×4 IMPLANT
TOWEL GREEN STERILE FF (TOWEL DISPOSABLE) ×8 IMPLANT
UNDERPAD 30X36 HEAVY ABSORB (UNDERPADS AND DIAPERS) ×4 IMPLANT

## 2021-05-06 NOTE — Op Note (Signed)
I assisted Surgeon(s) and Role:    * Betha Loa, MD - Primary    Cindee Salt, MD - Assisting on the Procedure(s): LEFT CARPAL TUNNEL RELEASE LEFT ULNAR NERVE DECOMPRESSION on 05/06/2021.  I provided assistance on this case as follows: setup, approach release of carpal tunnel, closure of the wound, approach and identification of the ulnar nerve at rhe elbow, retraction for release of the nerve proximal and distally , check for dislocation , closure of the wound and application of the dressings.  Electronically signed by: Cindee Salt, MD Date: 05/06/2021 Time: 11:34 AM

## 2021-05-06 NOTE — Transfer of Care (Signed)
Immediate Anesthesia Transfer of Care Note  Patient: Delray Alt  Procedure(s) Performed: LEFT CARPAL TUNNEL RELEASE (Left: Wrist) LEFT ULNAR NERVE DECOMPRESSION (Left: Elbow)  Patient Location: PACU  Anesthesia Type:General  Level of Consciousness: awake, alert  and oriented  Airway & Oxygen Therapy: Patient Spontanous Breathing and Patient connected to face mask oxygen  Post-op Assessment: Report given to RN and Post -op Vital signs reviewed and stable  Post vital signs: Reviewed and stable  Last Vitals:  Vitals Value Taken Time  BP 156/83 05/06/21 1143  Temp    Pulse 63 05/06/21 1145  Resp 13 05/06/21 1145  SpO2 97 % 05/06/21 1145  Vitals shown include unvalidated device data.  Last Pain:  Vitals:   05/06/21 0808  TempSrc: Oral  PainSc: 0-No pain         Complications: No notable events documented.

## 2021-05-06 NOTE — Anesthesia Preprocedure Evaluation (Addendum)
Anesthesia Evaluation  Patient identified by MRN, date of birth, ID band Patient awake    Reviewed: Allergy & Precautions, NPO status , Patient's Chart, lab work & pertinent test results, reviewed documented beta blocker date and time   Airway Mallampati: IV  TM Distance: >3 FB Neck ROM: Full    Dental  (+) Teeth Intact, Dental Advisory Given   Pulmonary sleep apnea and Continuous Positive Airway Pressure Ventilation ,    Pulmonary exam normal breath sounds clear to auscultation       Cardiovascular hypertension, Pt. on home beta blockers and Pt. on medications (-) angina+ CAD, + Past MI, + Cardiac Stents, + CABG and +CHF  Normal cardiovascular exam Rhythm:Regular Rate:Normal     Neuro/Psych PSYCHIATRIC DISORDERS Depression TIA Neuromuscular disease    GI/Hepatic negative GI ROS, Neg liver ROS,   Endo/Other  diabetes, Type 2, Oral Hypoglycemic Agents  Renal/GU Renal disease     Musculoskeletal LEFT CARPAL TUNNEL SYNDROME; LEFT ULNA NEUROPATHY   Abdominal   Peds  Hematology  (+) Blood dyscrasia (plavix), ,   Anesthesia Other Findings Day of surgery medications reviewed with the patient.  Reproductive/Obstetrics                            Anesthesia Physical Anesthesia Plan  ASA: 3  Anesthesia Plan: General   Post-op Pain Management:    Induction: Intravenous  PONV Risk Score and Plan: 2 and Dexamethasone and Ondansetron  Airway Management Planned: LMA  Additional Equipment:   Intra-op Plan:   Post-operative Plan: Extubation in OR  Informed Consent: I have reviewed the patients History and Physical, chart, labs and discussed the procedure including the risks, benefits and alternatives for the proposed anesthesia with the patient or authorized representative who has indicated his/her understanding and acceptance.     Dental advisory given  Plan Discussed with:  CRNA  Anesthesia Plan Comments:         Anesthesia Quick Evaluation

## 2021-05-06 NOTE — Op Note (Signed)
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  Left carpal tunnel syndrome 2.  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  Left carpal tunnel release 2.  Left ulnar nerve decompression at the elbow  SURGEON:  Betha Loa, MD  ASSISTANT: Cindee Salt, MD.  ANESTHESIA: General  IV FLUIDS:  Per anesthesia flow sheet.  ESTIMATED BLOOD LOSS:  Minimal.  COMPLICATIONS:  None.  SPECIMENS:  None.  TOURNIQUET TIME:    Total Tourniquet Time Documented: Upper Arm (Left) - 38 minutes Total: Upper Arm (Left) - 38 minutes   DISPOSITION:  Stable to PACU.  LOCATION: [facility]  INDICATIONS: 75 year old male with numbness and tingling in the left hand.  Positive nerve conduction studies.  He wishes to have a carpal tunnel release and ulnar nerve decompression at the elbow for management of his symptoms.  Risks, benefits and alternatives of surgery were discussed including the risk of blood loss; infection; damage to nerves, vessels, tendons, ligaments, bone; failure of surgery; need for additional surgery; complications with wound healing; continued pain; recurrence of carpal tunnel syndrome; and damage to motor branch. He voiced understanding of these risks and elected to proceed.   OPERATIVE COURSE:  After being identified preoperatively by myself, the patient and I agreed upon the procedure and site of procedure.  The surgical site was marked.  Surgical consent had been signed.  He was given IV Ancef as preoperative antibiotic prophylaxis.  He was transferred to the operating room and placed on the operating room table in supine position with the left upper extremity on an armboard.  General anesthesia was induced by the anesthesiologist.  Left upper extremity was prepped and draped in normal sterile  orthopaedic fashion.  A surgical pause was performed between the surgeons, anesthesia, and operating room staff, and all were in agreement as to the patient, procedure, and site of procedure.  Tourniquet at the proximal aspect of the extremity was inflated to 250 mmHg after exsanguination of the arm with an Esmarch bandage  Incision was made over the transverse carpal ligament and carried into the subcutaneous tissues by spreading technique.  Bipolar electrocautery was used to obtain hemostasis.  The palmar fascia was sharply incised.  The transverse carpal ligament was identified and sharply incised.  It was incised distally first.  The flexor tendons were identified.  The flexor tendon to the ring finger was identified and retracted radially.  The transverse carpal ligament was then incised proximally.  Scissors were used to split the distal aspect of the volar antebrachial fascia.  A finger was placed into the wound to ensure complete decompression, which was the case.  The nerve was examined.  It was flattened and hyperemic and was adherent to the radial leaflet.  The motor branch was identified and was intact.  The wound was copiously irrigated with sterile saline.  It was then closed with 4-0 nylon in a horizontal mattress fashion.  Incision was then made at the medial side of the elbow.  This is carried into subcutaneous tissues by spreading technique.  Bipolar electrocautery was used to obtain hemostasis.  The ulnar nerve was identified proximal to Osborne's ligament.  Osborne's ligament was then sharply  incised under direct visualization while protecting the ulnar nerve.  The muscular fascia was released over the FCU muscle.  The FCU muscle bellies were spread.  The investing fascia surrounding nerve was then released under direct visualization while protecting the nerve.  Motor branch to the ulnar head of the FCU was identified and protected.  The nerve was then decompressed proximally.  Again the muscular  fascia was released initially.  The investing fascia surrounding the nerve was then released while protecting the nerve with the [facility] guide.  The elbow was flexed.  The nerve rode up on the epicondyle but did not subluxate.  The wound was copiously irrigated with sterile saline.  The volar leaflet of Osborne's ligament was repaired to the posterior subcutaneous tissues to provide a bolster for the nerve.  Inverted interrupted 4-0 Vicryl sutures were placed in subcutaneous tissues and skin was closed with 4-0 nylon in a horizontal mattress fashion.  The wounds were injected with 0.25% plain Marcaine to aid in postoperative analgesia.  They were dressed with sterile Xeroform, 4x4s, an ABD, and wrapped with Kerlix and an Ace bandage.  Tourniquet was deflated at 38 minutes.  Fingertips were pink with brisk capillary refill after deflation of the tourniquet.  Operative drapes were broken down.  The patient was awoken from anesthesia safely.  He was transferred back to stretcher and taken to the PACU in stable condition.  I will see him back in the office in 1 week for postoperative followup.  He states he has pain medication left over from his previous surgery and does not need a new prescription.    Betha Loa, MD Electronically signed, 05/06/21

## 2021-05-06 NOTE — Discharge Instructions (Addendum)
[facility] Instructions Hand Surgery  Wound Care: Keep your hand elevated above the level of your heart.  Do not allow it to dangle by your side.  Keep the dressing dry and do not remove it unless your doctor advises you to do so.  He will usually change it at the time of your post-op visit.  Moving your fingers is advised to stimulate circulation but will depend on the site of your surgery.  If you have a splint applied, your doctor will advise you regarding movement.  Activity: Do not drive or operate machinery today.  Rest today and then you may return to your normal activity and work as indicated by your physician.  Diet:  Drink liquids today or eat a light diet.  You may resume a regular diet tomorrow.    General expectations: Pain for two to three days. Fingers may become slightly swollen.  Call your doctor if any of the following occur: Severe pain not relieved by pain medication. Elevated temperature. Dressing soaked with blood. Inability to move fingers. White or bluish color to fingers.   Post Anesthesia Home Care Instructions  Activity: Get plenty of rest for the remainder of the day. A responsible individual must stay with you for 24 hours following the procedure.  For the next 24 hours, DO NOT: -Drive a car -Advertising copywriter -Drink alcoholic beverages -Take any medication unless instructed by your physician -Make any legal decisions or sign important papers.  Meals: Start with liquid foods such as gelatin or soup. Progress to regular foods as tolerated. Avoid greasy, spicy, heavy foods. If nausea and/or vomiting occur, drink only clear liquids until the nausea and/or vomiting subsides. Call your physician if vomiting continues.  Special Instructions/Symptoms: Your throat may feel dry or sore from the anesthesia or the breathing tube placed in your throat during surgery. If this causes discomfort, gargle with warm salt water. The discomfort should disappear within  24 hours.  If you had a scopolamine patch placed behind your ear for the management of post- operative nausea and/or vomiting:  1. The medication in the patch is effective for 72 hours, after which it should be removed.  Wrap patch in a tissue and discard in the trash. Wash hands thoroughly with soap and water. 2. You may remove the patch earlier than 72 hours if you experience unpleasant side effects which may include dry mouth, dizziness or visual disturbances. 3. Avoid touching the patch. Wash your hands with soap and water after contact with the patch.      Next dose of Tylenol after 2:12pm as needed for pain.

## 2021-05-06 NOTE — H&P (Signed)
Paul Levine is an 75 y.o. male.   Chief Complaint: left hand numbness HPI: 74 yo male with numbness and tingling left hand.  Positive nerve conduction studies.  He wishes to proceed with left carpal tunnel release and ulnar nerve decompression at the elbow with transposition as necessary.  Allergies:  Allergies  Allergen Reactions   Metformin And Related Other (See Comments)    GI upset    Past Medical History:  Diagnosis Date   Agent Orange poisoning    CAD (coronary artery disease)    a. s/p CABG x3 (LIMA->LAD, Y radial graft off of LIMA to OM3 and RPL1) ~2003;  b. 01/2015 STEMI (aVR/aVL)/PCI: LM 95/LCX 100p (3.5x15 Resolute DES covering LM/LCX), LAD 95ost/p (PTCA), 156m, 90d after LIMA insertion (2.25x32 Synergy DES), RCA 95/26m, 100d, LIMA->LAD patent, free radial comes off of LIMA and plugs into OM3 (ok) and then RPL1 (ok). 50% stenosis in graft between touchdowns. Nl EF.   Depression    Diabetes mellitus without complication (HCC)    Essential hypertension    History of kidney stones    Hyperlipidemia    Impingement of ulnar nerve, right    Ischemic cardiomyopathy    a.  Echo 02/22/15:   EF 35-40%, mod diff HK, WMA cannot be excluded, mod LAE, mild RVE, mild to mod RAE   NSTEMI (non-ST elevated myocardial infarction) (HCC)    2016   Sleep apnea    TIA (transient ischemic attack)     Past Surgical History:  Procedure Laterality Date   CARDIAC CATHETERIZATION N/A 02/20/2015   Procedure: Left Heart Cath and Coronary Angiography;  Surgeon: Lennette Bihari, MD;  Location: [facility];  Service: Cardiovascular;  Laterality: N/A;   CARDIAC CATHETERIZATION N/A 02/20/2015   Procedure: Coronary Stent Intervention;  Surgeon: Lennette Bihari, MD;  Location: [facility];  Service: Cardiovascular;  Laterality: N/A;   CARPAL TUNNEL RELEASE Right 12/24/2020   Procedure: RIGHT CARPAL TUNNEL RELEASE;  Surgeon: Betha Loa, MD;  Location: [facility];  Service:  Orthopedics;  Laterality: Right;  75 MIN   CORONARY ARTERY BYPASS GRAFT     CYSTOSCOPY W/ URETERAL STENT PLACEMENT Right 02/04/2020   Procedure: CYSTOSCOPY WITH RETROGRADE PYELOGRAM/URETERAL STENT PLACEMENT;  Surgeon: Crist Fat, MD;  Location: [facility] OR;  Service: Urology;  Laterality: Right;   NEPHROLITHOTOMY Right 03/12/2020   Procedure: RIGHT NEPHROLITHOTOMY PERCUTANEOUS WITH SURGEON ACCESS;  Surgeon: Crist Fat, MD;  Location: [facility];  Service: Urology;  Laterality: Right;   ULNAR NERVE TRANSPOSITION Right 12/24/2020   Procedure: RIGHT ULNAR NERVE DECOMPRESSION;  Surgeon: Betha Loa, MD;  Location: [facility];  Service: Orthopedics;  Laterality: Right;    Family History: Family History  Problem Relation Age of Onset   Congestive Heart Failure Mother    Heart failure Mother    Hypertension Mother    Hypertension Sister    Heart attack Neg Hx    Stroke Neg Hx     Social History:   reports that he has never smoked. He has never used smokeless tobacco. He reports current alcohol use. He reports that he does not use drugs.  Medications: Medications Prior to Admission  Medication Sig Dispense Refill   Apoaequorin (PREVAGEN EXTRA STRENGTH) 20 MG CAPS Take 20 mg by mouth daily.     aspirin 81 MG tablet Take 81 mg by mouth daily.     clopidogrel (PLAVIX) 75 MG tablet Take 1 tablet (75 mg total) by  mouth daily. Restart on 8/16     Coenzyme Q10 200 MG capsule Take 200 mg by mouth daily.     glipiZIDE (GLUCOTROL) 5 MG tablet Take 5 mg by mouth 2 (two) times daily before a meal.      glucosamine-chondroitin 500-400 MG tablet Take 1 tablet by mouth daily.     losartan (COZAAR) 100 MG tablet Take 100 mg by mouth daily.     metoprolol (LOPRESSOR) 50 MG tablet Take 25 mg by mouth 2 (two) times daily.     Multiple Vitamins-Minerals (MULTIVITAMIN GUMMIES ADULT PO) Take 4 tablets by mouth daily.      rosuvastatin (CRESTOR) 20 MG tablet Take 10 mg by mouth daily.      vitamin B-12 (CYANOCOBALAMIN) 1000 MCG tablet Take 1,000 mcg by mouth daily.     HYDROcodone-acetaminophen (NORCO) 5-325 MG tablet 1-2 tabs po q6 hours prn pain 20 tablet 0    Results for orders placed or performed during the hospital encounter of 05/06/21 (from the past 48 hour(s))  Glucose, capillary     Status: Abnormal   Collection Time: 05/06/21  7:58 AM  Result Value Ref Range   Glucose-Capillary 143 (H) 70 - 99 mg/dL    Comment: Glucose reference range applies only to samples taken after fasting for at least 8 hours.  Basic metabolic panel     Status: Abnormal   Collection Time: 05/06/21  8:00 AM  Result Value Ref Range   Sodium 137 135 - 145 mmol/L   Potassium 4.5 3.5 - 5.1 mmol/L   Chloride 103 98 - 111 mmol/L   CO2 23 22 - 32 mmol/L   Glucose, Bld 155 (H) 70 - 99 mg/dL    Comment: Glucose reference range applies only to samples taken after fasting for at least 8 hours.   BUN 33 (H) 8 - 23 mg/dL   Creatinine, Ser 8.09 (H) 0.61 - 1.24 mg/dL   Calcium 9.4 8.9 - 98.3 mg/dL   GFR, Estimated 54 (L) >60 mL/min    Comment: (NOTE) Calculated using the CKD-EPI Creatinine Equation (2021)    Anion gap 11 5 - 15    Comment: Performed at [facility], [address], [place], [place] [address]    No results found.     Blood pressure (!) 159/71, pulse (!) 58, temperature 97.7 F (36.5 C), temperature source Oral, resp. rate 18, height 5\' 6"  (1.676 m), weight 82.8 kg, SpO2 100 %.  General appearance: alert, cooperative, and appears stated age Head: Normocephalic, without obvious abnormality, atraumatic Neck: supple, symmetrical, trachea midline Cardio: regular rate and rhythm Resp: clear to auscultation bilaterally Extremities: Intact sensation and capillary refill all digits.  +epl/fpl/io.  No wounds.  Pulses: 2+ and symmetric Skin: Skin color, texture, turgor normal. No rashes or lesions Neurologic: Grossly normal Incision/Wound: none  Assessment/Plan Left carpal  tunnel syndrome and ulnar nerve compression at the elbow.  Non operative and operative treatment options have been discussed with the patient and patient wishes to proceed with operative treatment. Risks, benefits, and alternatives of surgery have been discussed and the patient agrees with the plan of care.   05/06/2021, 10:15 AM

## 2021-05-06 NOTE — Anesthesia Procedure Notes (Addendum)
Procedure Name: LMA Insertion Date/Time: 05/06/2021 10:42 AM Performed by: Burna Cash, CRNA Pre-anesthesia Checklist: Patient identified, Emergency Drugs available, Suction available and Patient being monitored Patient Re-evaluated:Patient Re-evaluated prior to induction Oxygen Delivery Method: Circle system utilized Preoxygenation: Pre-oxygenation with 100% oxygen Induction Type: IV induction Ventilation: Mask ventilation without difficulty LMA: LMA inserted LMA Size: 4.0 Number of attempts: 1 Airway Equipment and Method: Bite block Placement Confirmation: positive ETCO2 Tube secured with: Tape Dental Injury: Teeth and Oropharynx as per pre-operative assessment

## 2021-05-06 NOTE — Anesthesia Postprocedure Evaluation (Signed)
Anesthesia Post Note  Patient: Paul Levine  Procedure(s) Performed: LEFT CARPAL TUNNEL RELEASE (Left: Wrist) LEFT ULNAR NERVE DECOMPRESSION (Left: Elbow)     Patient location during evaluation: PACU Anesthesia Type: General Level of consciousness: awake and alert Pain management: pain level controlled Vital Signs Assessment: post-procedure vital signs reviewed and stable Respiratory status: spontaneous breathing, nonlabored ventilation, respiratory function stable and patient connected to nasal cannula oxygen Cardiovascular status: blood pressure returned to baseline and stable Postop Assessment: no apparent nausea or vomiting Anesthetic complications: no   No notable events documented.  Last Vitals:  Vitals:   05/06/21 1200 05/06/21 1220  BP: (!) 149/79 140/86  Pulse: 62 (!) 58  Resp: 12 12  Temp:  36.4 C  SpO2: 94% 95%    Last Pain:  Vitals:   05/06/21 1220  TempSrc:   PainSc: 0-No pain                 Cecile Hearing

## 2021-05-07 ENCOUNTER — Encounter (HOSPITAL_BASED_OUTPATIENT_CLINIC_OR_DEPARTMENT_OTHER): Payer: Self-pay | Admitting: Orthopedic Surgery

## 2022-05-06 IMAGING — RF DG RETROGRADE PYELOGRAM
1 series · 3 of 3 positions shown · non-contrast
Comparison: CT from earlier the same day

CLINICAL DATA: Flank pain.  Right hydronephrosis with calculi

EXAM:
RETROGRADE PYELOGRAM

[Series 1: unknown protocol · 0.20mm/px · 3 of 3 slices shown]
[im 1/3]
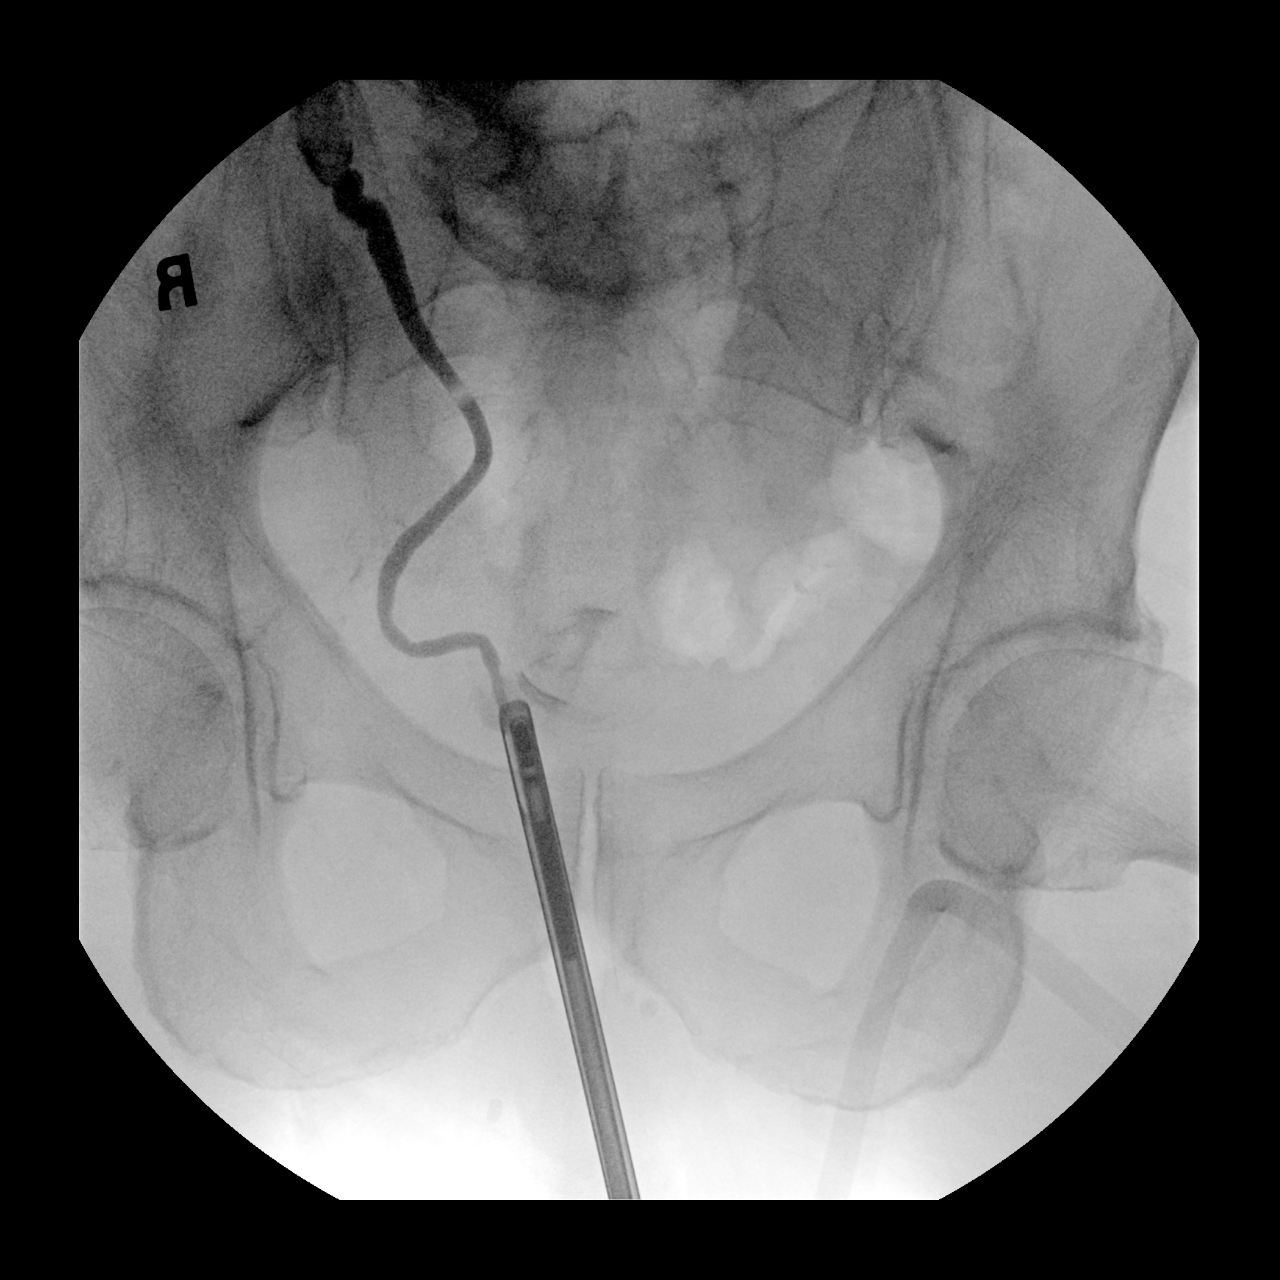
[im 2/3]
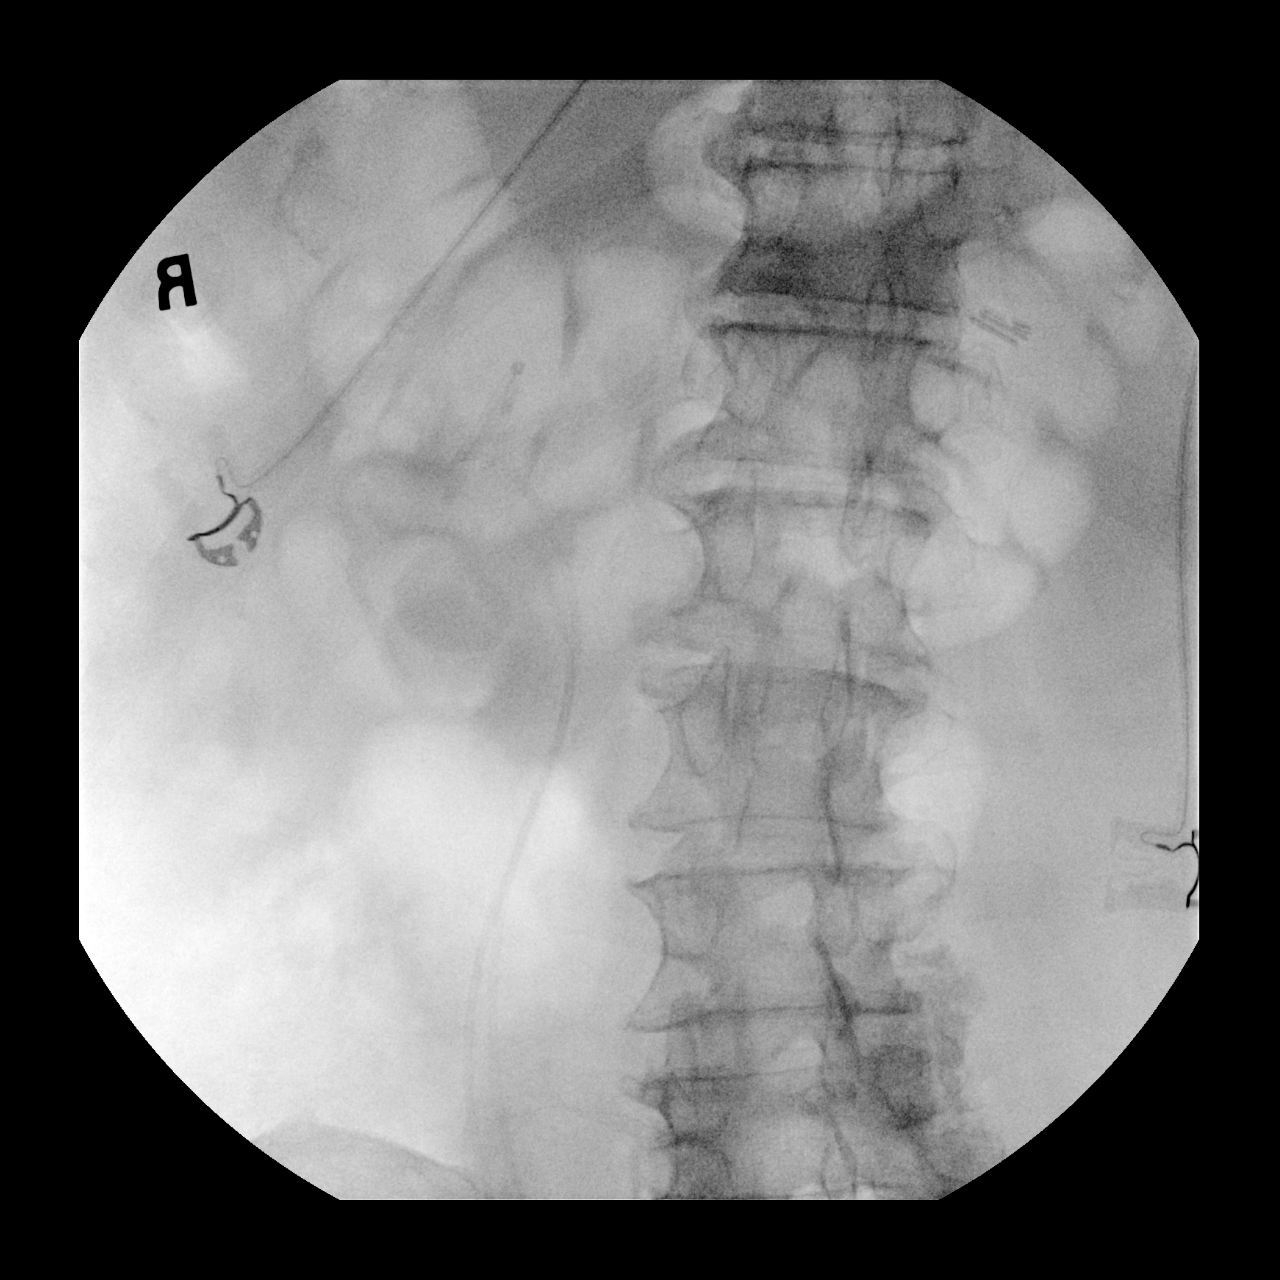
[im 3/3]
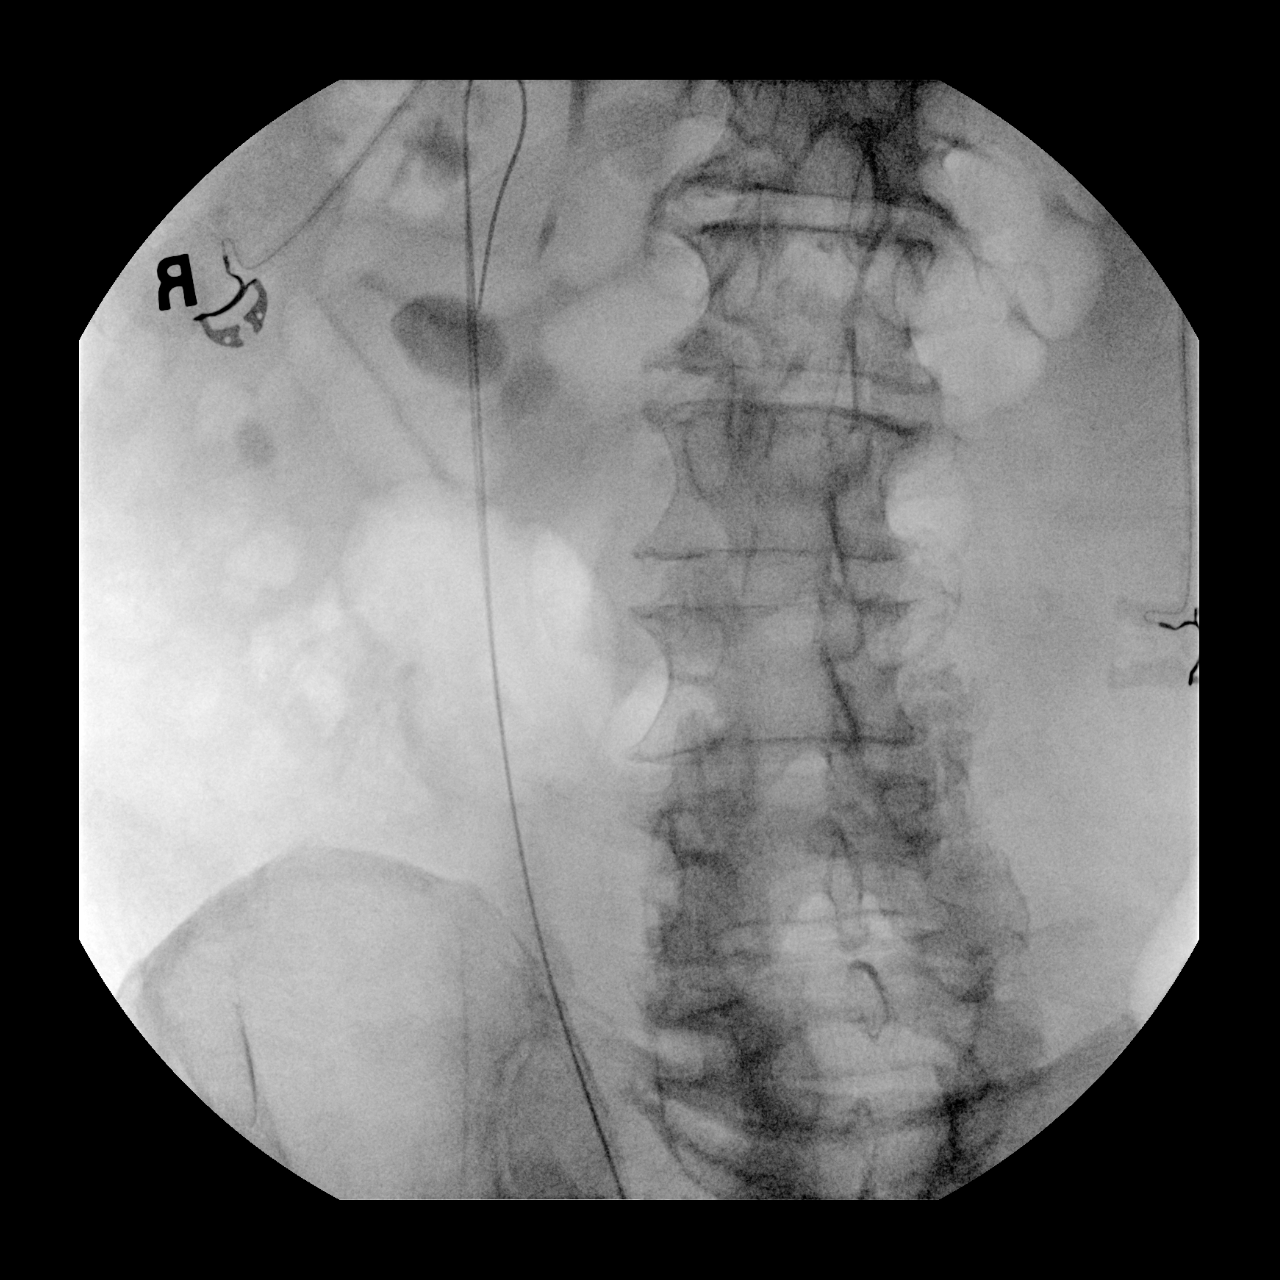

[3 of 3 positions shown; findings below may reference images not displayed]

FINDINGS: Series of fluoroscopic spot images document retrograde pyelography.
Filling defects in the mid right ureter partially visualized.
Incomplete opacification of proximal ureter and collecting system
these images. A 2.4 cm radiodense calculus in the region of the
right renal collecting system. Final image demonstrates retrograde
guidewire catheterization to the level of the renal collecting
system.
IMPRESSION: Right urolithiasis

## 2022-05-06 IMAGING — CT CT RENAL STONE PROTOCOL
2 of 4 series · 15 of 46 positions shown, 17 images · non-contrast
Comparison: None.

CLINICAL DATA: Right flank pain

EXAM:
CT ABDOMEN AND PELVIS WITHOUT CONTRAST
TECHNIQUE: Multidetector CT imaging of the abdomen and pelvis was performed
following the standard protocol without oral or IV contrast.

[Series 3: ap without · axial · non-contrast · 0.77mm/px · z∈[-482,-62]mm · 12 of 98 slices shown, 14 images]
[im 7/98  soft-tissue]
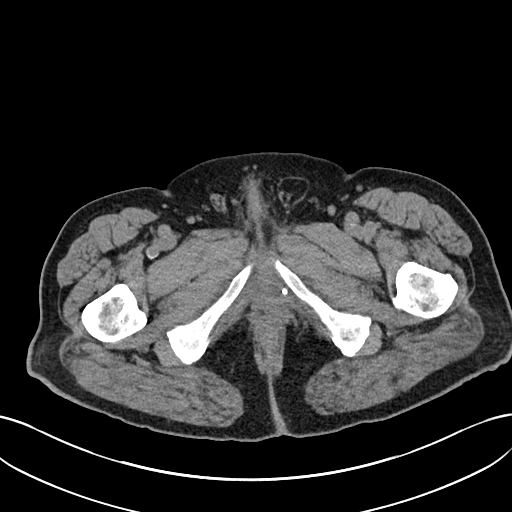
[im 7/98  bone]
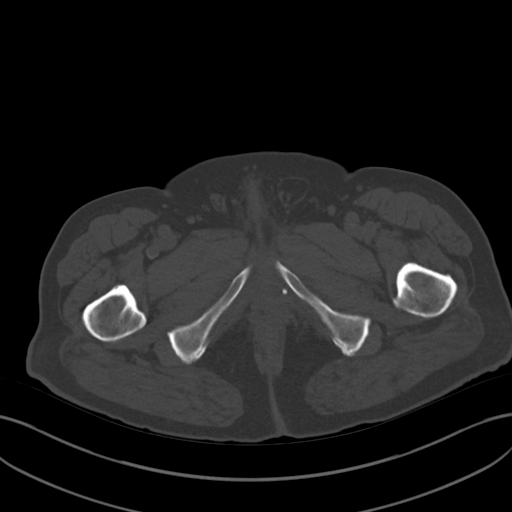
[im 13/98  soft-tissue]
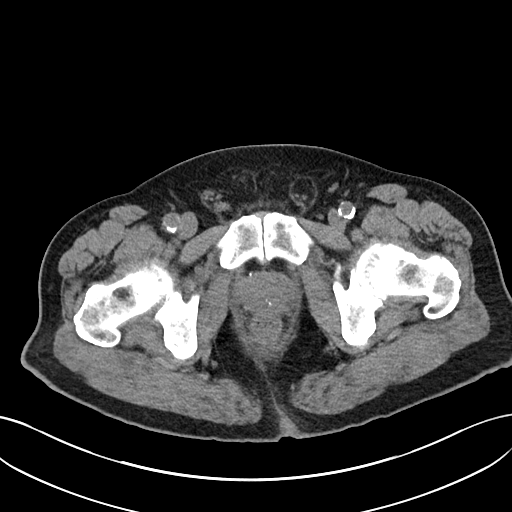
[im 25/98  soft-tissue]
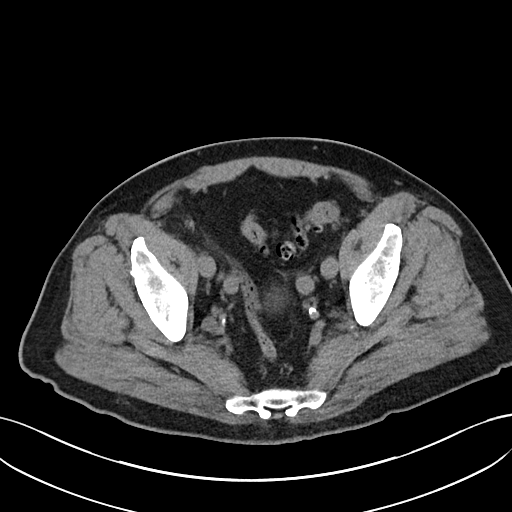
[im 31/98  soft-tissue]
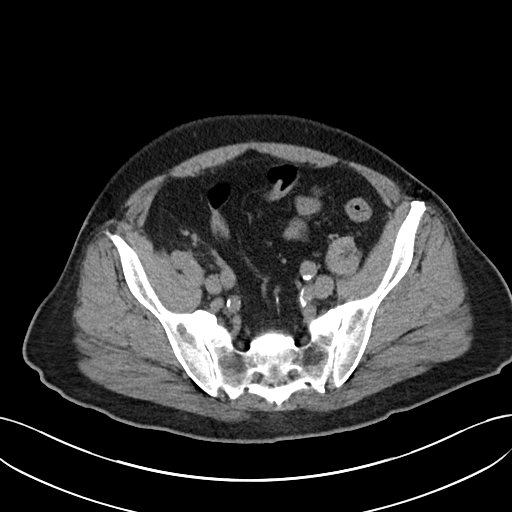
[im 37/98  soft-tissue]
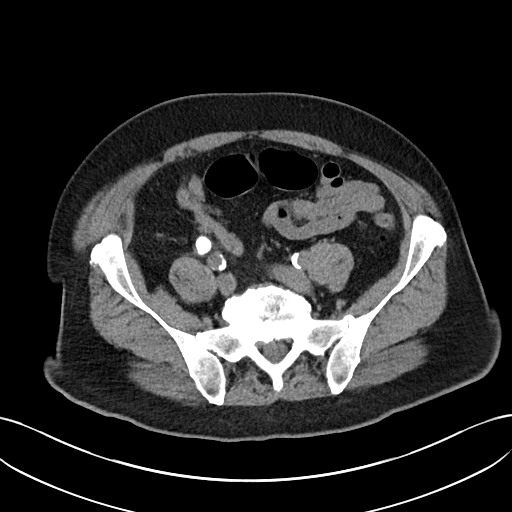
[im 43/98  soft-tissue]
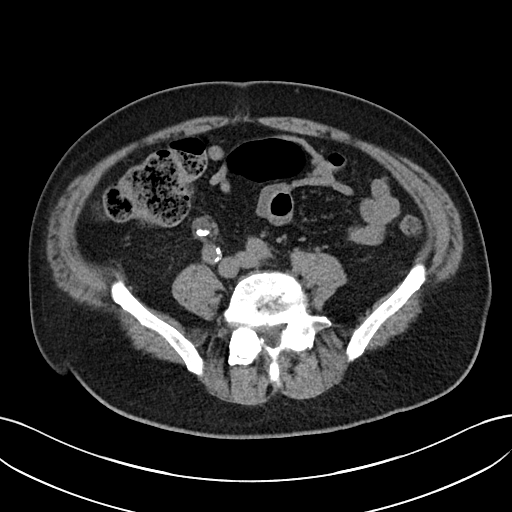
[im 55/98  soft-tissue]
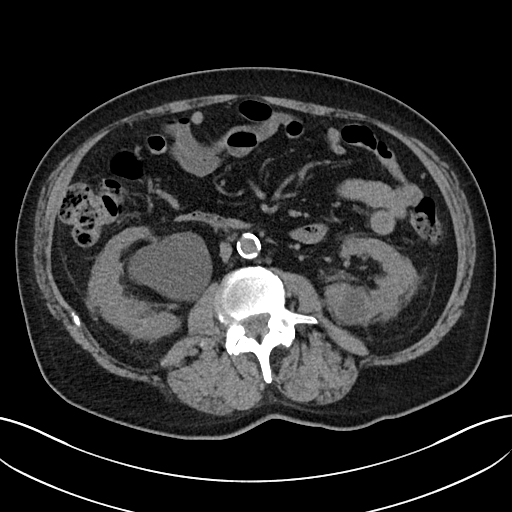
[im 61/98  soft-tissue]
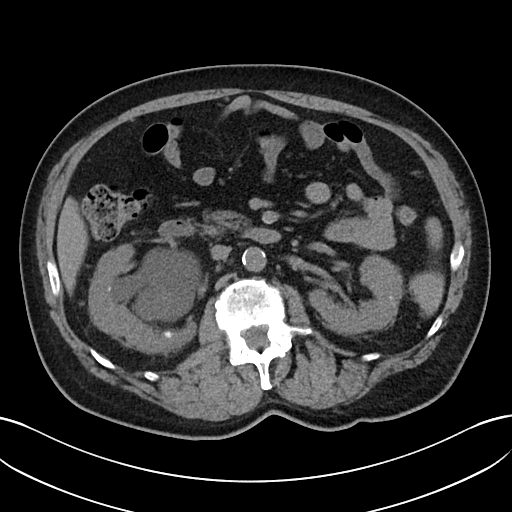
[im 67/98  soft-tissue]
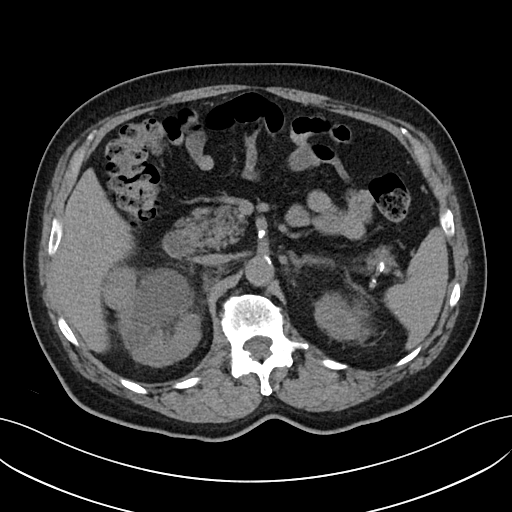
[im 67/98  bone]
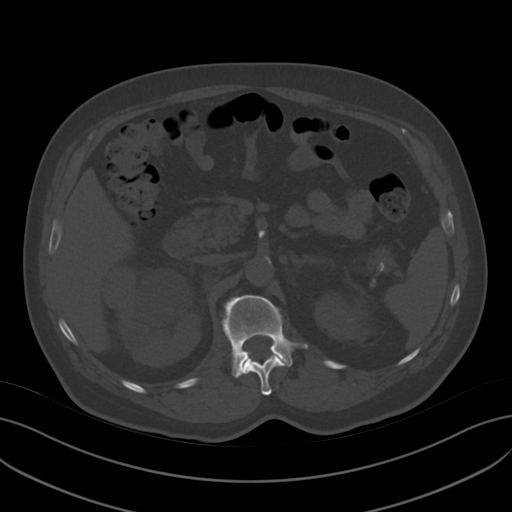
[im 73/98  soft-tissue]
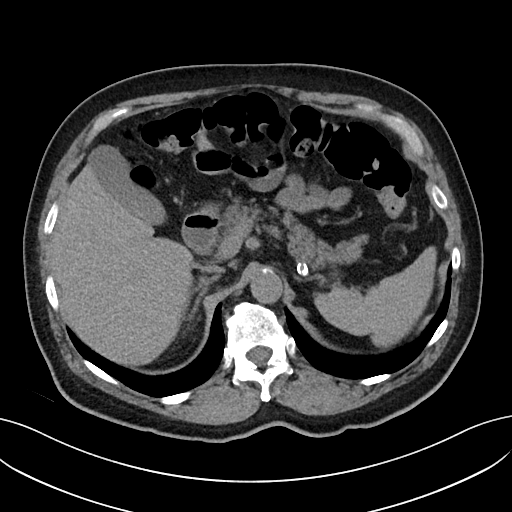
[im 85/98  soft-tissue]
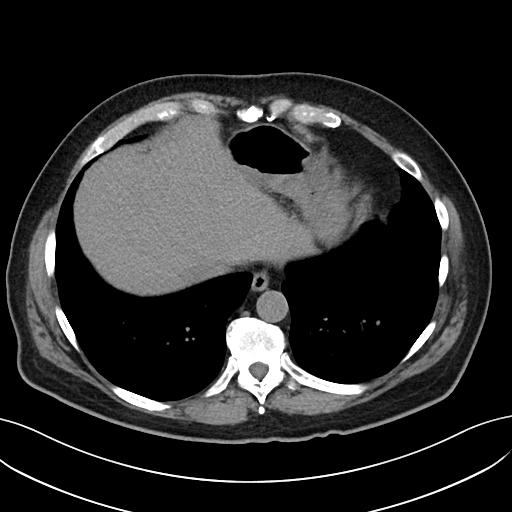
[im 91/98  soft-tissue]
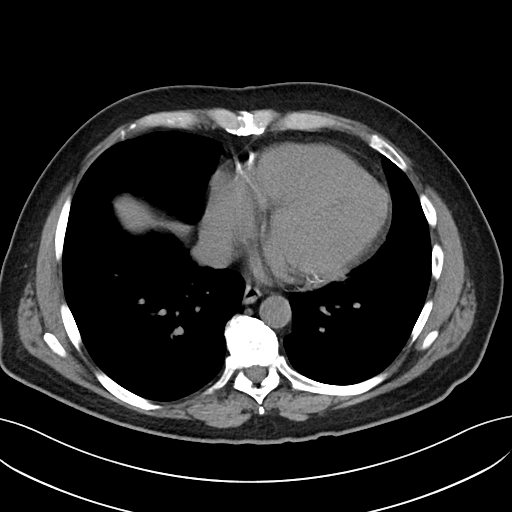

[Series 6: cor · coronal · 0.81mm/px · 3 of 100 slices shown]
[im 34/100  soft-tissue]
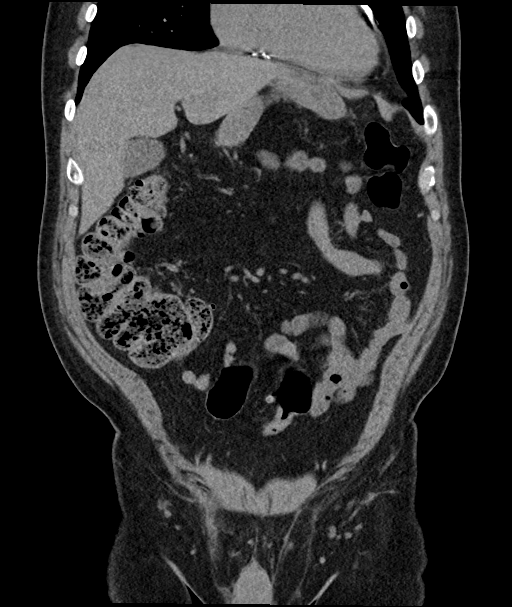
[im 45/100  soft-tissue]
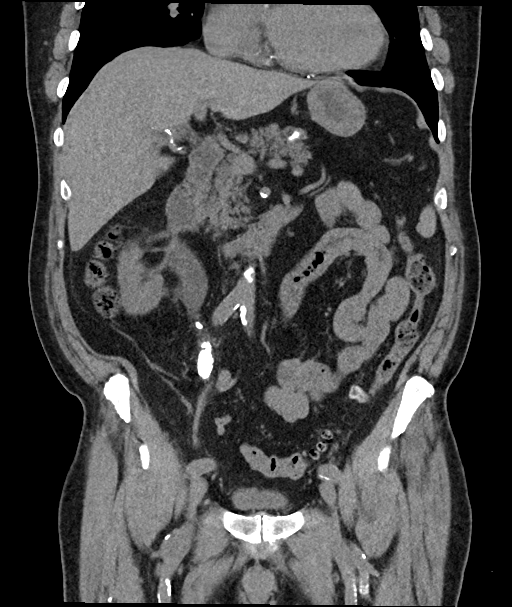
[im 56/100  soft-tissue]
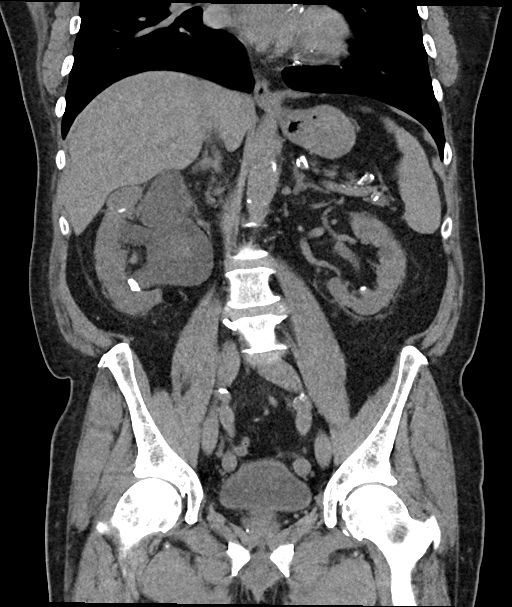

[15 of 46 positions shown; findings below may reference images not displayed]

FINDINGS: Lower chest: Lung bases are clear. There are multiple foci of
coronary artery calcification.

Hepatobiliary: No focal liver lesions are appreciable on this
noncontrast enhanced study. Gallbladder wall is not appreciably
thickened. There is no biliary duct dilatation.

Pancreas: There is no pancreatic mass or inflammatory focus.

Spleen: No splenic lesions are evident.

Adrenals/Urinary Tract: Adrenals bilaterally appear normal. There is
a cyst arising from the inferior aspect of the right kidney
measuring 2.7 x 2.3 cm. There is an apparent 4 mm hyperdense cyst in
the mid left kidney with suspected tiny hyperdense cysts elsewhere.
There is severe hydronephrosis on the right. The right kidney is
edematous. There is a calculus in the posterior aspect of the right
renal pelvis which measures 2.4 x 2.0 cm. There is a calculus in the
posterior mid right kidney measuring 1.0 x 0.9 cm. There is a
calculus in the posterior mid right kidney measuring 1.5 x 1.1 cm.
Scattered subcentimeter calculi are noted throughout the right
kidney elsewhere. On the left, there are scattered calculi ranging
in size from as small as 1 mm to as large as 5 x 5 mm. No
hydronephrosis is noted on the left.

There is ureterectasis on the right. There is a calculus at the
lumbosacral level in the right ureter measuring 2.9 x 1.6 cm.
Slightly proximal to this larger calculus, there is a calculus
measuring 0.8 x 0.6 cm. The urinary bladder is midline with wall
thickness within normal limits.

Stomach/Bowel: There is no appreciable bowel wall or mesenteric
thickening. There are scattered sigmoid diverticula without
diverticulitis. There is no appreciable bowel obstruction. The
terminal ileum appears unremarkable. There is no appreciable free
air or portal venous air.

Vascular/Lymphatic: No abdominal aortic aneurysm. There is aortic
and iliac artery atherosclerosis. There is moderate calcification in
both proximal renal arteries as well as in the proximal superior
mesenteric artery. No adenopathy is evident in the abdomen or
pelvis.

Reproductive: Prostate and seminal vesicles are normal in size and
contour. There are scattered small prostatic calculi. No evident
pelvic mass.

Other: The appendix appears normal. No evident abscess or ascites in
the abdomen or pelvis. There is fat in the left inguinal ring.

Musculoskeletal: There is degenerative change in the lumbar spine.
No blastic or lytic lesions are evident. No intramuscular lesions
are appreciable
IMPRESSION: 1. Severe hydronephrosis on the right. There is an irregular
calculus at the lumbosacral junction on the right measuring 2.9 x
1.6 cm. There is a 0.8 x 0.6 cm calculus slightly proximal to this
larger calculus on the right. There are calculi within the right
renal pelvis, largest measuring 0.4 x 2.0 cm. Multiple smaller
calculi noted in each kidney.

2.  No hydronephrosis on the left.

3. No bowel obstruction. Occasional sigmoid diverticula without
diverticulitis. No abscess in the abdomen or pelvis. Appendix
appears normal.

4. Aortic Atherosclerosis (DT72B-[id number].V). Multiple foci of mesenteric
arterial vessel calcification as well as foci of coronary artery and
iliac artery atherosclerosis.
